# Patient Record
Sex: Female | Born: 1980
Health system: Southern US, Community
[De-identification: ages and names within clinical notes are randomized; demographics above are authoritative.]

## PROBLEM LIST (undated history)

## (undated) DIAGNOSIS — T8859XA Other complications of anesthesia, initial encounter: Secondary | ICD-10-CM

## (undated) DIAGNOSIS — T4145XA Adverse effect of unspecified anesthetic, initial encounter: Secondary | ICD-10-CM

## (undated) DIAGNOSIS — Z789 Other specified health status: Secondary | ICD-10-CM

## (undated) DIAGNOSIS — Z9889 Other specified postprocedural states: Secondary | ICD-10-CM

## (undated) DIAGNOSIS — R112 Nausea with vomiting, unspecified: Secondary | ICD-10-CM

---
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
## 2000-10-08 ENCOUNTER — Other Ambulatory Visit: Admission: RE | Admit: 2000-10-08 | Discharge: 2000-10-08 | Payer: Self-pay | Admitting: Obstetrics and Gynecology

## 2000-11-26 ENCOUNTER — Other Ambulatory Visit: Admission: RE | Admit: 2000-11-26 | Discharge: 2000-11-26 | Payer: Self-pay | Admitting: Obstetrics and Gynecology

## 2001-04-20 ENCOUNTER — Other Ambulatory Visit: Admission: RE | Admit: 2001-04-20 | Discharge: 2001-04-20 | Payer: Self-pay | Admitting: Obstetrics and Gynecology

## 2001-11-27 ENCOUNTER — Other Ambulatory Visit: Admission: RE | Admit: 2001-11-27 | Discharge: 2001-11-27 | Payer: Self-pay | Admitting: Obstetrics and Gynecology

## 2002-12-01 ENCOUNTER — Other Ambulatory Visit: Admission: RE | Admit: 2002-12-01 | Discharge: 2002-12-01 | Payer: Self-pay | Admitting: Obstetrics and Gynecology

## 2003-10-26 ENCOUNTER — Emergency Department (HOSPITAL_COMMUNITY): Admission: EM | Admit: 2003-10-26 | Discharge: 2003-10-26 | Payer: Self-pay | Admitting: *Deleted

## 2003-12-02 ENCOUNTER — Other Ambulatory Visit: Admission: RE | Admit: 2003-12-02 | Discharge: 2003-12-02 | Payer: Self-pay | Admitting: Obstetrics and Gynecology

## 2004-12-12 ENCOUNTER — Other Ambulatory Visit: Admission: RE | Admit: 2004-12-12 | Discharge: 2004-12-12 | Payer: Self-pay | Admitting: Gynecology

## 2007-07-01 ENCOUNTER — Other Ambulatory Visit: Admission: RE | Admit: 2007-07-01 | Discharge: 2007-07-01 | Payer: Self-pay | Admitting: Obstetrics and Gynecology

## 2008-09-23 ENCOUNTER — Other Ambulatory Visit: Admission: RE | Admit: 2008-09-23 | Discharge: 2008-09-23 | Payer: Self-pay | Admitting: Obstetrics and Gynecology

## 2008-09-23 ENCOUNTER — Ambulatory Visit: Payer: Self-pay | Admitting: Women's Health

## 2008-09-23 ENCOUNTER — Encounter: Payer: Self-pay | Admitting: Women's Health

## 2008-11-02 ENCOUNTER — Ambulatory Visit: Payer: Self-pay | Admitting: Women's Health

## 2008-11-16 ENCOUNTER — Ambulatory Visit: Payer: Self-pay | Admitting: Women's Health

## 2009-06-21 ENCOUNTER — Inpatient Hospital Stay (HOSPITAL_COMMUNITY): Admission: AD | Admit: 2009-06-21 | Discharge: 2009-06-24 | Payer: Self-pay | Admitting: Obstetrics & Gynecology

## 2010-06-06 LAB — CBC
HCT: 30.6 % — ABNORMAL LOW (ref 36.0–46.0)
Hemoglobin: 10.3 g/dL — ABNORMAL LOW (ref 12.0–15.0)
Hemoglobin: 13.8 g/dL (ref 12.0–15.0)
MCHC: 33.4 g/dL (ref 30.0–36.0)
MCV: 90.7 fL (ref 78.0–100.0)
RBC: 3.37 MIL/uL — ABNORMAL LOW (ref 3.87–5.11)
RBC: 4.65 MIL/uL (ref 3.87–5.11)
WBC: 14.6 10*3/uL — ABNORMAL HIGH (ref 4.0–10.5)
WBC: 8.3 10*3/uL (ref 4.0–10.5)

## 2010-06-06 LAB — RPR: RPR Ser Ql: NONREACTIVE

## 2010-09-04 LAB — ABO/RH: RH Type: POSITIVE

## 2011-02-01 ENCOUNTER — Other Ambulatory Visit: Payer: Self-pay | Admitting: Obstetrics & Gynecology

## 2011-02-02 ENCOUNTER — Inpatient Hospital Stay (HOSPITAL_COMMUNITY)
Admission: AD | Admit: 2011-02-02 | Discharge: 2011-02-02 | Disposition: A | Discharge status: Home / Self Care | Payer: 59 | Source: Ambulatory Visit | Attending: Obstetrics and Gynecology | Admitting: Obstetrics and Gynecology

## 2011-02-02 DIAGNOSIS — O4700 False labor before 37 completed weeks of gestation, unspecified trimester: Secondary | ICD-10-CM | POA: Insufficient documentation

## 2011-02-02 MED ORDER — BETAMETHASONE SOD PHOS & ACET 6 (3-3) MG/ML IJ SUSP
12.0000 mg | Freq: Once | INTRAMUSCULAR | Status: AC
  Administered 2011-02-02: 12 mg via INTRAMUSCULAR
  Filled 2011-02-02: qty 2

## 2011-02-02 NOTE — Progress Notes (Signed)
Pt here for second steriod injection.  

## 2011-02-02 NOTE — ED Notes (Signed)
steroid injection # 2 . Pt to f/u in office as scheduled

## 2011-03-18 LAB — GC/CHLAMYDIA PROBE AMP, GENITAL
Chlamydia: NEGATIVE
Gonorrhea: NEGATIVE

## 2011-03-19 NOTE — L&D Delivery Note (Signed)
Delivery Note - VBAC  Complete dilation at 1004 Onset of pushing at 1010 FHR second stage cat 1 - no decels  Anesthesia: epidural  Delivery of a viable female at 1047 by CNM in LOA position.  Nuchal Cord - none. Cord double clamped after cessation of pulsation, cut by FOB.  Cord blood sample collected  Placenta delivered shultz at 1053 intact with 3 VC.  Placenta to L&D for routine disposal. Uterine tone firm / bleeding small  no laceration identified  Est. Blood Loss (mL):300  Complications: none  Mom to postpartum.  Baby to nursery-stable.  Ramonda Galyon 04/03/2011, 11:15 AM

## 2011-04-03 ENCOUNTER — Inpatient Hospital Stay (HOSPITAL_COMMUNITY)
Admission: AD | Admit: 2011-04-03 | Discharge: 2011-04-05 | DRG: 775 | Disposition: A | Discharge status: Home / Self Care | Payer: 59 | Source: Ambulatory Visit | Attending: Obstetrics & Gynecology | Admitting: Obstetrics & Gynecology

## 2011-04-03 ENCOUNTER — Encounter (HOSPITAL_COMMUNITY): Payer: Self-pay | Admitting: *Deleted

## 2011-04-03 ENCOUNTER — Encounter (HOSPITAL_COMMUNITY): Payer: Self-pay | Admitting: Anesthesiology

## 2011-04-03 ENCOUNTER — Inpatient Hospital Stay (HOSPITAL_COMMUNITY): Payer: 59 | Admitting: Anesthesiology

## 2011-04-03 DIAGNOSIS — O34219 Maternal care for unspecified type scar from previous cesarean delivery: Principal | ICD-10-CM | POA: Diagnosis present

## 2011-04-03 DIAGNOSIS — Z22330 Carrier of Group B streptococcus: Secondary | ICD-10-CM

## 2011-04-03 DIAGNOSIS — O365990 Maternal care for other known or suspected poor fetal growth, unspecified trimester, not applicable or unspecified: Secondary | ICD-10-CM | POA: Diagnosis present

## 2011-04-03 DIAGNOSIS — O99892 Other specified diseases and conditions complicating childbirth: Secondary | ICD-10-CM | POA: Diagnosis present

## 2011-04-03 HISTORY — DX: Adverse effect of unspecified anesthetic, initial encounter: T41.45XA

## 2011-04-03 HISTORY — DX: Other specified health status: Z78.9

## 2011-04-03 HISTORY — DX: Other specified postprocedural states: Z98.890

## 2011-04-03 HISTORY — DX: Other complications of anesthesia, initial encounter: T88.59XA

## 2011-04-03 HISTORY — DX: Other specified postprocedural states: R11.2

## 2011-04-03 LAB — CBC
HCT: 41.1 % (ref 36.0–46.0)
Hemoglobin: 14.4 g/dL (ref 12.0–15.0)
MCH: 29.6 pg (ref 26.0–34.0)
MCHC: 35 g/dL (ref 30.0–36.0)
MCV: 84.4 fL (ref 78.0–100.0)
Platelets: 245 10*3/uL (ref 150–400)
RBC: 4.87 MIL/uL (ref 3.87–5.11)
RDW: 14.4 % (ref 11.5–15.5)
WBC: 11.5 10*3/uL — ABNORMAL HIGH (ref 4.0–10.5)

## 2011-04-03 LAB — RPR: RPR Ser Ql: NONREACTIVE

## 2011-04-03 MED ORDER — IBUPROFEN 600 MG PO TABS: 600.0000 mg | ORAL_TABLET | Freq: Four times a day (QID) | ORAL | Status: DC | PRN

## 2011-04-03 MED ORDER — LIDOCAINE HCL 1.5 % IJ SOLN
INTRAMUSCULAR | Status: DC | PRN
  Administered 2011-04-03 (×2): 5 mL via EPIDURAL

## 2011-04-03 MED ORDER — DIPHENHYDRAMINE HCL 50 MG/ML IJ SOLN: 12.5000 mg | INTRAMUSCULAR | Status: DC | PRN

## 2011-04-03 MED ORDER — LACTATED RINGERS IV SOLN: 500.0000 mL | Freq: Once | INTRAVENOUS | Status: DC

## 2011-04-03 MED ORDER — PENICILLIN G POTASSIUM 5000000 UNITS IJ SOLR
5.0000 10*6.[IU] | Freq: Once | INTRAMUSCULAR | Status: AC
  Administered 2011-04-03: 5 10*6.[IU] via INTRAVENOUS
  Filled 2011-04-03: qty 5

## 2011-04-03 MED ORDER — ACETAMINOPHEN 325 MG PO TABS: 650.0000 mg | ORAL_TABLET | ORAL | Status: DC | PRN

## 2011-04-03 MED ORDER — CITRIC ACID-SODIUM CITRATE 334-500 MG/5ML PO SOLN: 30.0000 mL | ORAL | Status: DC | PRN

## 2011-04-03 MED ORDER — LACTATED RINGERS IV SOLN: 500.0000 mL | INTRAVENOUS | Status: DC | PRN

## 2011-04-03 MED ORDER — LIDOCAINE HCL (PF) 1 % IJ SOLN
30.0000 mL | INTRAMUSCULAR | Status: DC | PRN
  Administered 2011-04-03: 30 mL via SUBCUTANEOUS
  Filled 2011-04-03: qty 30

## 2011-04-03 MED ORDER — EPHEDRINE 5 MG/ML INJ
10.0000 mg | INTRAVENOUS | Status: DC | PRN
  Administered 2011-04-03: 10 mg via INTRAVENOUS
  Filled 2011-04-03: qty 4

## 2011-04-03 MED ORDER — BENZOCAINE-MENTHOL 20-0.5 % EX AERO
INHALATION_SPRAY | CUTANEOUS | Status: AC
  Administered 2011-04-03: 1 via TOPICAL
  Filled 2011-04-03: qty 56

## 2011-04-03 MED ORDER — ONDANSETRON HCL 4 MG/2ML IJ SOLN: 4.0000 mg | Freq: Four times a day (QID) | INTRAMUSCULAR | Status: DC | PRN

## 2011-04-03 MED ORDER — FENTANYL 2.5 MCG/ML BUPIVACAINE 1/10 % EPIDURAL INFUSION (WH - ANES)
INTRAMUSCULAR | Status: DC | PRN
  Administered 2011-04-03: 14 mL/h via EPIDURAL

## 2011-04-03 MED ORDER — OXYTOCIN BOLUS FROM INFUSION
500.0000 mL | Freq: Once | INTRAVENOUS | Status: AC
  Administered 2011-04-03: 500 mL via INTRAVENOUS
  Filled 2011-04-03: qty 500
  Filled 2011-04-03: qty 1000

## 2011-04-03 MED ORDER — PHENYLEPHRINE 40 MCG/ML (10ML) SYRINGE FOR IV PUSH (FOR BLOOD PRESSURE SUPPORT)
80.0000 ug | PREFILLED_SYRINGE | INTRAVENOUS | Status: DC | PRN
  Administered 2011-04-03 (×2): 80 ug via INTRAVENOUS

## 2011-04-03 MED ORDER — OXYCODONE-ACETAMINOPHEN 5-325 MG PO TABS: 2.0000 | ORAL_TABLET | ORAL | Status: DC | PRN

## 2011-04-03 MED ORDER — LANOLIN HYDROUS EX OINT: TOPICAL_OINTMENT | CUTANEOUS | Status: DC | PRN

## 2011-04-03 MED ORDER — FLEET ENEMA 7-19 GM/118ML RE ENEM: 1.0000 | ENEMA | RECTAL | Status: DC | PRN

## 2011-04-03 MED ORDER — OXYCODONE-ACETAMINOPHEN 5-325 MG PO TABS: 1.0000 | ORAL_TABLET | ORAL | Status: DC | PRN

## 2011-04-03 MED ORDER — EPHEDRINE 5 MG/ML INJ: 10.0000 mg | INTRAVENOUS | Status: DC | PRN

## 2011-04-03 MED ORDER — LACTATED RINGERS IV SOLN
INTRAVENOUS | Status: DC
  Administered 2011-04-03 (×3): via INTRAVENOUS

## 2011-04-03 MED ORDER — DIPHENHYDRAMINE HCL 25 MG PO CAPS: 25.0000 mg | ORAL_CAPSULE | Freq: Four times a day (QID) | ORAL | Status: DC | PRN

## 2011-04-03 MED ORDER — WITCH HAZEL-GLYCERIN EX PADS: 1.0000 "application " | MEDICATED_PAD | CUTANEOUS | Status: DC | PRN

## 2011-04-03 MED ORDER — FENTANYL 2.5 MCG/ML BUPIVACAINE 1/10 % EPIDURAL INFUSION (WH - ANES)
14.0000 mL/h | INTRAMUSCULAR | Status: DC
  Administered 2011-04-03: 10 mL/h via EPIDURAL
  Filled 2011-04-03 (×2): qty 60

## 2011-04-03 MED ORDER — BENZOCAINE-MENTHOL 20-0.5 % EX AERO
1.0000 "application " | INHALATION_SPRAY | CUTANEOUS | Status: DC | PRN
  Administered 2011-04-03: 1 via TOPICAL

## 2011-04-03 MED ORDER — DIBUCAINE 1 % RE OINT: 1.0000 "application " | TOPICAL_OINTMENT | RECTAL | Status: DC | PRN

## 2011-04-03 MED ORDER — IBUPROFEN 600 MG PO TABS
600.0000 mg | ORAL_TABLET | Freq: Four times a day (QID) | ORAL | Status: DC
  Administered 2011-04-03 – 2011-04-05 (×8): 600 mg via ORAL
  Filled 2011-04-03 (×8): qty 1

## 2011-04-03 MED ORDER — OXYTOCIN 20 UNITS IN LACTATED RINGERS INFUSION - SIMPLE: 125.0000 mL/h | Freq: Once | INTRAVENOUS | Status: DC

## 2011-04-03 MED ORDER — PHENYLEPHRINE 40 MCG/ML (10ML) SYRINGE FOR IV PUSH (FOR BLOOD PRESSURE SUPPORT)
80.0000 ug | PREFILLED_SYRINGE | INTRAVENOUS | Status: DC | PRN
  Filled 2011-04-03: qty 5

## 2011-04-03 MED ORDER — PRENATAL MULTIVITAMIN CH
1.0000 | ORAL_TABLET | Freq: Every day | ORAL | Status: DC
  Administered 2011-04-03 – 2011-04-05 (×3): 1 via ORAL
  Filled 2011-04-03 (×4): qty 1

## 2011-04-03 MED ORDER — SIMETHICONE 80 MG PO CHEW: 80.0000 mg | CHEWABLE_TABLET | ORAL | Status: DC | PRN

## 2011-04-03 MED ORDER — PENICILLIN G POTASSIUM 5000000 UNITS IJ SOLR
2.5000 10*6.[IU] | INTRAMUSCULAR | Status: DC
  Administered 2011-04-03: 2.5 10*6.[IU] via INTRAVENOUS
  Filled 2011-04-03 (×3): qty 2.5

## 2011-04-03 NOTE — Anesthesia Procedure Notes (Signed)
Epidural Patient location during procedure: OB Start time: 04/03/2011 4:26 AM  Staffing Anesthesiologist: Brayton Caves R Performed by: anesthesiologist   Preanesthetic Checklist Completed: patient identified, site marked, surgical consent, pre-op evaluation, timeout performed, IV checked, risks and benefits discussed and monitors and equipment checked  Epidural Patient position: sitting Prep: site prepped and draped and DuraPrep Patient monitoring: continuous pulse ox and blood pressure Approach: midline Injection technique: LOR air and LOR saline  Needle:  Needle type: Tuohy  Needle gauge: 17 G Needle length: 9 cm Needle insertion depth: 6 cm Catheter type: closed end flexible Catheter size: 19 Gauge Catheter at skin depth: 11 cm Test dose: negative  Assessment Events: blood not aspirated, injection not painful, no injection resistance, negative IV test and no paresthesia  Additional Notes Patient identified.  Risk benefits discussed including failed block, incomplete pain control, headache, nerve damage, paralysis, blood pressure changes, nausea, vomiting, reactions to medication both toxic or allergic, and postpartum back pain.  Patient expressed understanding and wished to proceed.  All questions were answered.  Sterile technique used throughout procedure and epidural site dressed with sterile barrier dressing. No paresthesia or other complications noted.The patient did not experience any signs of intravascular injection such as tinnitus or metallic taste in mouth nor signs of intrathecal spread such as rapid motor block. Please see nursing notes for vital signs.

## 2011-04-03 NOTE — Anesthesia Preprocedure Evaluation (Signed)
Anesthesia Evaluation  Patient identified by MRN, date of birth, ID band Patient awake    Reviewed: Allergy & Precautions, H&P , Patient's Chart, lab work & pertinent test results  History of Anesthesia Complications (+) PONV  Airway Mallampati: II TM Distance: >3 FB Neck ROM: full    Dental No notable dental hx.    Pulmonary neg pulmonary ROS,  clear to auscultation  Pulmonary exam normal       Cardiovascular neg cardio ROS regular Normal    Neuro/Psych Negative Neurological ROS  Negative Psych ROS   GI/Hepatic negative GI ROS, Neg liver ROS,   Endo/Other  Negative Endocrine ROS  Renal/GU negative Renal ROS     Musculoskeletal   Abdominal   Peds  Hematology negative hematology ROS (+)   Anesthesia Other Findings   Reproductive/Obstetrics (+) Pregnancy                           Anesthesia Physical Anesthesia Plan  ASA: II  Anesthesia Plan: Epidural   Post-op Pain Management:    Induction:   Airway Management Planned:   Additional Equipment:   Intra-op Plan:   Post-operative Plan:   Informed Consent: I have reviewed the patients History and Physical, chart, labs and discussed the procedure including the risks, benefits and alternatives for the proposed anesthesia with the patient or authorized representative who has indicated his/her understanding and acceptance.     Plan Discussed with:   Anesthesia Plan Comments:         Anesthesia Quick Evaluation  

## 2011-04-03 NOTE — Progress Notes (Signed)
Pt sttes she had cramping start about 2300-states she feels the cramping in her vagina

## 2011-04-03 NOTE — H&P (Signed)
  OB ADMISSION/ HISTORY & PHYSICAL:  Admission Date: 04/03/2011  1:44 AM  Admit Diagnosis: Active labor / previous cesarean section - desires TOLAC  Regina Mason is a 31 y.o. female presenting for onset of ctx at midnight.  Prenatal History: G2P1001   EDC : 04/12/2011, by Last Menstrual Period  Prenatal care at [facility] Primary OB provider - Marlinda Mike CNM  Prenatal course complicated by previous c-section - arrest of active labor / + GBS  Prenatal Labs: ABO, Rh: A (06/19 0000) Positive Antibody: Negative (06/19 0000) Rubella: Immune (07/03 0000)  RPR: Nonreactive (07/03 0000)  HBsAg: Negative (06/19 0000)  HIV: Non-reactive (07/03 0000)  GBS: Positive (12/31 0000)  1 hr Glucola : normal / passed GTT   Medical / Surgical History :  Past medical history:  Past Medical History  Diagnosis Date  . Complication of anesthesia   . PONV (postoperative nausea and vomiting)   . No pertinent past medical history      Past surgical history:  Past Surgical History  Procedure Date  . Cesarean section      Family History:  Family History  Problem Relation Age of Onset  . Anesthesia problems Neg Hx   . Hypotension Neg Hx   . Malignant hyperthermia Neg Hx   . Pseudochol deficiency Neg Hx      Social History:  reports that she has never smoked. She does not have any smokeless tobacco history on file. She reports that she does not drink alcohol or use illicit drugs.   Allergies: Review of patient's allergies indicates no known allergies.   Current Medications at time of admission:  Prenatal vitamin  Review of Systems: + ctx since midnight / no LOF / small spotting  Physical Exam: General: pleasant / ctx uncomfortable Heart:RRR Lungs: Clear  Abdomen: Soft / gravid / non-tender Extremities:no edema Genitalia / VE: 4cm / 80% / vtx / -1 / intact membranes per triage nurse  FHR: 145 / cat 1  TOCO: 2-4 minutes  Assessment: Active  labor Previous c-section - desires TOLAC + GBS  Plan:  Admit PCN protocol for GBS prophylaxis Epidural for pain management  Regina Mason 04/03/2011, 0400

## 2011-04-03 NOTE — Progress Notes (Signed)
Patient ID: Regina Mason, female   DOB: 11-02-1980, 31 y.o.   MRN: [id number]  S: Feeling some intermittent pressure - no pain     Tolerating contractions well since epidural   O:  VS: Blood pressure 110/62, pulse 88, temperature 98.2 F (36.8 C), temperature source Oral, resp. rate 20, height 5\' 2"  (1.575 m), weight 91.173 kg (201 lb), last menstrual period 07/06/2010, SpO2 100.00%.        FHR : baseline 145 / variability moderate / accels + / decels none        Toco: contractions every 2-4 minutes / moderate         Cervix : 7/90/vtx/ o station / BBOW / bloody show +        Membranes: AROM - clear   A: Active labor - TOLAC     FHR category 1  P: expectant management       Recheck with constant pressure / urge to push or in 1-2 hours  Dr Billy Coast if c-section per patient - Dr Billy Coast updated with admission / status  [name] 04/03/2011, 9:16 AM

## 2011-04-03 NOTE — Progress Notes (Signed)
Patient ID: Regina Mason, female   DOB: Jul 19, 1980, 31 y.o.   MRN: [id number] S: Feeling strong urge to push     Tolerating contractions ok with epidural - pain with pressure during ctx   O:  VS: Blood pressure 110/62, pulse 110, temperature 97.9 F (36.6 C), temperature source Oral, resp. rate 20, height 5\' 2"  (1.575 m), weight 91.173 kg (201 lb), last menstrual period 07/06/2010, SpO2 100.00%.        FHR : baseline 145 / variability moderate         Toco: contractions every 2-3 minutes / moderate         Cervix : 10 / 100  vtx +2        Membranes: clear fluid with show  A: Active labor     FHR category 1  P: begin active second stage     Dr Billy Coast updated     Marlinda Mike 04/03/2011, 10:26 AM

## 2011-04-04 ENCOUNTER — Encounter (HOSPITAL_COMMUNITY): Payer: Self-pay | Admitting: *Deleted

## 2011-04-04 LAB — CBC
HCT: 35.4 % — ABNORMAL LOW (ref 36.0–46.0)
Hemoglobin: 12.2 g/dL (ref 12.0–15.0)
MCH: 29.2 pg (ref 26.0–34.0)
MCHC: 34.5 g/dL (ref 30.0–36.0)
MCV: 84.7 fL (ref 78.0–100.0)
Platelets: 227 10*3/uL (ref 150–400)
RBC: 4.18 MIL/uL (ref 3.87–5.11)
RDW: 14.7 % (ref 11.5–15.5)
WBC: 14.5 10*3/uL — ABNORMAL HIGH (ref 4.0–10.5)

## 2011-04-04 NOTE — Progress Notes (Signed)
PPD 1 SVD  S:  Reports feeling well, working on breastfeeding, infant sleepy and poor latch.             Tolerating po/ No nausea or vomiting             Bleeding is decreased             Pain controlled withMotrin             Up ad lib / ambulatory  Newborn  Information for the patient's newborn:  Gillian, Meeuwsen [[id number]]  female    O:  A & O x 3 NAD             VS: Blood pressure 108/61, pulse 87, temperature 98 F (36.7 C), temperature source Oral, resp. rate 18, height 5\' 2"  (1.575 m), weight 91.173 kg (201 lb), last menstrual period 07/06/2010, SpO2 97.00%, unknown if currently breastfeeding.  LABS:  Basename 04/04/11 0530 04/03/11 0315  HGB 12.2 14.4  HCT 35.4* 41.1    I&O: I/O last 3 completed shifts: In: 800 [Other:800] Out: 1425 [Urine:1125; Blood:300]      Lungs: Clear and unlabored  Heart: regular rate and rhythm / no mumurs  Abdomen: soft, non-tender, non-distended              Fundus: firm, non-tender, @U   Perineum: intact, mild edema  Lochia: small  Extremities: trace edema, no calf pain or tenderness, neg Homans    A/P: PPD # 1 31 y.o., [id number] S/P: VBAC    Doing well - stable status  Routine post partum orders  Lactation support today for SGA infant (2500gm)  Anticipate discharge home in AM.  PAUL,DANIELA, CNM, MSN 04/04/2011, 10:37 AM

## 2011-04-04 NOTE — Anesthesia Postprocedure Evaluation (Signed)
  Anesthesia Post-op Note  Patient: Regina Mason  Procedure(s) Performed: * No procedures listed *  Patient Location: PACU and Mother/Baby  Anesthesia Type: Epidural  Level of Consciousness: awake, alert  and oriented  Airway and Oxygen Therapy: Patient Spontanous Breathing  Post-op Assessment: Post-op Vital signs reviewed and Patient's Cardiovascular Status Stable  Post-op Vital Signs: Reviewed and stable  Complications: No apparent anesthesia complications

## 2011-04-04 NOTE — Progress Notes (Signed)
SW consult received for "babies who have drug screen sent." SW reviewed babies chart and there has not been any drug screens ordered.  Therefore, SW has screened out this referral as an error.  SW will only see if appropriate consult is ordered. 

## 2011-04-04 NOTE — Anesthesia Postprocedure Evaluation (Signed)
  Anesthesia Post-op Note  Patient: Regina Mason  Procedure(s) Performed: * No procedures listed *  Patient Location: PACU and Mother/Baby  Anesthesia Type: Epidural  Level of Consciousness: awake, alert  and oriented  Airway and Oxygen Therapy: Patient Spontanous Breathing  Post-op Assessment: Post-op Vital signs reviewed, Patient's Cardiovascular Status Stable, No headache, No backache, No residual numbness and No residual motor weakness  Post-op Vital Signs: Reviewed and stable  Complications: No apparent anesthesia complications

## 2011-04-05 ENCOUNTER — Encounter (HOSPITAL_COMMUNITY): Payer: Self-pay | Admitting: Obstetrics and Gynecology

## 2011-04-05 MED ORDER — TETANUS-DIPHTH-ACELL PERTUSSIS 5-2.5-18.5 LF-MCG/0.5 IM SUSP
0.5000 mL | Freq: Once | INTRAMUSCULAR | Status: AC
  Administered 2011-04-05: 0.5 mL via INTRAMUSCULAR
  Filled 2011-04-05: qty 0.5

## 2011-04-05 MED ORDER — IBUPROFEN 600 MG PO TABS: 600.0000 mg | ORAL_TABLET | Freq: Four times a day (QID) | ORAL | Status: AC

## 2011-04-05 NOTE — Discharge Summary (Signed)
Obstetric Discharge Summary Reason for Admission: onset of labor Prenatal Procedures: ultrasound Intrapartum Procedures: spontaneous vaginal delivery Postpartum Procedures: none Complications-Operative and Postpartum: none Hemoglobin  Date Value Range Status  04/04/2011 12.2  12.0-15.0 (g/dL) Final     HCT  Date Value Range Status  04/04/2011 35.4* 36.0-46.0 (%) Final    Discharge Diagnoses: Term Pregnancy-delivered and Pos GBS  Discharge Information: Date: 04/05/2011 Activity: pelvic rest Diet: routine Medications: Ibuprofen Condition: stable Instructions: refer to practice specific booklet Discharge to: home Follow-up Information    Follow up with BAILEY,TANYA, CNM in 6 weeks.   Contact information:   [address] [place] [address] [phone]          Newborn Data: Live born female on 04/03/11 Birth Weight: 5 lb 10.7 oz (2571 g) APGAR: 9, 9  Home with mother.  Regina Mason K 04/05/2011, 10:27 AM

## 2011-04-05 NOTE — Progress Notes (Signed)
Patient ID: Regina Mason, female   DOB: 25-Jul-1980, 31 y.o.   MRN: [id number] PPD # 2  Subjective: Pt reports feeling well and ready for d/c home/ Pain controlled with prescription NSAID's including motrin Tolerating po/ Voiding without problems/ No n/v Bleeding is light/ Newborn info:  Information for the patient's newborn:  [name], [name] [[id number]]  female  Feeding: breast    Objective:  VS: Blood pressure 95/64, pulse 78, temperature 97.7 F (36.5 C), temperature source Oral, resp. rate 18   Basename 04/04/11 0530 04/03/11 0315  WBC 14.5* 11.5*  HGB 12.2 14.4  HCT 35.4* 41.1  PLT 227 245    Blood type: A/Positive/-- (06/19 0000) Rubella: Immune (07/03 0000)    Physical Exam:  General: alert, cooperative and no distress Abdomen: soft, nontender, normal bowel sounds Uterine Fundus: firm, below umbilicus, nontender Perineum: not inspected Lochia: minimal Ext: Homans sign is negative, no sign of DVT and no edema, redness or tenderness in the calves or thighs   A/P: PPD # 2/ [id number] Doing well and stable for discharge home RX: Ibuprofen 600mg  po Q 6 hrs prn pain #30 Refill x 1 WOB/GYN booklet given Routine pp visit in 6wks  Signed: Arlana Lindau [facility] 04/05/11 1000

## 2011-04-07 NOTE — Discharge Summary (Signed)
Reviewed, agree 

## 2011-04-09 ENCOUNTER — Ambulatory Visit (HOSPITAL_COMMUNITY)
Admit: 2011-04-09 | Discharge: 2011-04-09 | Disposition: A | Discharge status: Home / Self Care | Payer: 59 | Attending: Obstetrics & Gynecology | Admitting: Obstetrics & Gynecology

## 2011-04-09 NOTE — Progress Notes (Signed)
Adult Lactation Consultation Outpatient Visit Note  Patient Name: Regina Mason(mother)     BABY:  [name] Date of Birth: 08/12/1980                                  DOB:  [birth year] Gestational Age at Delivery: [redacted]w[redacted]d                 Birth weight:  5-10.7 Type of Delivery: NSVD                                    Weight today: 5-14.6  Breastfeeding History: Frequency of Breastfeeding: every 1 -3 hours Length of Feeding: 15-20 minutes Voids: 6-8/24 hrs Stools: 2-3 yellow/24 hrs  Supplementing / Method:NONE Pumping:  Type of Pump:Pumo in style   Frequency:prn for comfort only  Volume:    Comments:    Consultation Evaluation:Mother here with c/o engorgement x 2 days and baby sleepy at breast.  Baby is 2 days old and past birth weight with exclusive breastfeeding.  Mothers breasts very full with slight engorgement.  Reviewed engorgement treatment with patient.  Observed mother latch baby easily and well to right breast.  Baby nursed very well x 10-15 min with swallow after every suck.  Breast softened nicely after feeding.  Instructed on waking techniques and methods to keep baby active at breast.  Initial Feeding Assessment: Pre-feed Weight: Post-feed Weight: Amount Transferred: Comments:  Additional Feeding Assessment: Pre-feed Weight: Post-feed Weight: Amount Transferred: Comments:  Additional Feeding Assessment: Pre-feed Weight: Post-feed Weight: Amount Transferred: Comments:  Total Breast milk Transferred this Visit: pc weight not done Total Supplement Given: none  Additional Interventions:   Follow-Up Will call prn     Hansel Feinstein 04/09/2011, 4:15 PM

## 2011-04-26 ENCOUNTER — Emergency Department (HOSPITAL_COMMUNITY)
Admission: EM | Admit: 2011-04-26 | Discharge: 2011-04-27 | Disposition: A | Discharge status: Home / Self Care | Payer: 59 | Attending: Emergency Medicine | Admitting: Emergency Medicine

## 2011-04-26 ENCOUNTER — Encounter (HOSPITAL_COMMUNITY): Payer: Self-pay | Admitting: Emergency Medicine

## 2011-04-26 DIAGNOSIS — R51 Headache: Secondary | ICD-10-CM | POA: Insufficient documentation

## 2011-04-26 DIAGNOSIS — K219 Gastro-esophageal reflux disease without esophagitis: Secondary | ICD-10-CM | POA: Insufficient documentation

## 2011-04-26 DIAGNOSIS — R10816 Epigastric abdominal tenderness: Secondary | ICD-10-CM | POA: Insufficient documentation

## 2011-04-26 DIAGNOSIS — R1084 Generalized abdominal pain: Secondary | ICD-10-CM | POA: Insufficient documentation

## 2011-04-26 DIAGNOSIS — R3919 Other difficulties with micturition: Secondary | ICD-10-CM | POA: Insufficient documentation

## 2011-04-26 MED ORDER — FAMOTIDINE 20 MG PO TABS
40.0000 mg | ORAL_TABLET | Freq: Once | ORAL | Status: AC
  Administered 2011-04-26: 40 mg via ORAL
  Filled 2011-04-26: qty 2

## 2011-04-26 MED ORDER — GI COCKTAIL ~~LOC~~
30.0000 mL | Freq: Once | ORAL | Status: AC
  Administered 2011-04-26: 30 mL via ORAL
  Filled 2011-04-26: qty 30

## 2011-04-26 NOTE — ED Provider Notes (Signed)
History     CSN: [id number]  Arrival date & time 04/26/11  2025   First MD Initiated Contact with Patient 04/26/11 2301      Chief Complaint  Patient presents with  . Abdominal Pain    Generalized  . Headache    (Consider location/radiation/quality/duration/timing/severity/associated sxs/prior treatment) Patient is a 31 y.o. female presenting with abdominal pain and headaches. The history is provided by the patient.  Abdominal Pain The primary symptoms of the illness include abdominal pain.  Headache    patient presents with upper epigastric pain radiating to her back x7 hours. No associated fever, vomiting, diarrhea. Pain described as sharp in nature. She took Motrin for this symptoms improved. She did have some initial associated headache which has since resolved. Patient has a urinary symptoms. Nothing makes her symptoms worse no prior history of same. Notes a recent vaginal delivery 3 weeks ago, denies any pelvic pain some vaginal bleeding remains however has been decreasing.  Past Medical History  Diagnosis Date  . Complication of anesthesia   . PONV (postoperative nausea and vomiting)   . No pertinent past medical history   . Postpartum care following vaginal delivery 04/05/2011    Past Surgical History  Procedure Date  . Cesarean section     Family History  Problem Relation Age of Onset  . Anesthesia problems Neg Hx   . Hypotension Neg Hx   . Malignant hyperthermia Neg Hx   . Pseudochol deficiency Neg Hx     History  Substance Use Topics  . Smoking status: Never Smoker   . Smokeless tobacco: Not on file  . Alcohol Use: No    OB History    Grav Para Term Preterm Abortions TAB SAB Ect Mult Living   5 2 2       2       Review of Systems  Gastrointestinal: Positive for abdominal pain.  Neurological: Positive for headaches.  All other systems reviewed and are negative.    Allergies  Review of patient's allergies indicates no known allergies.  Home  Medications   Current Outpatient Rx  Name Route Sig Dispense Refill  . IBUPROFEN 200 MG PO TABS Oral Take 600 mg by mouth every 6 (six) hours as needed.    Marland Kitchen PRENATAL MULTIVITAMIN CH Oral Take 1 tablet by mouth daily.      BP 156/82  Pulse 58  Temp(Src) 97.4 F (36.3 C) (Oral)  Resp 18  Wt 201 lb (91.173 kg)  SpO2 100%  LMP 07/06/2010  Breastfeeding? Unknown  Physical Exam  Nursing note and vitals reviewed. Constitutional: She is oriented to person, place, and time. She appears well-developed and well-nourished.  Non-toxic appearance. No distress.  HENT:  Head: Normocephalic and atraumatic.  Eyes: Conjunctivae, EOM and lids are normal. Pupils are equal, round, and reactive to light.  Neck: Normal range of motion. Neck supple. No tracheal deviation present. No mass present.  Cardiovascular: Normal rate, regular rhythm and normal heart sounds.  Exam reveals no gallop.   No murmur heard. Pulmonary/Chest: Effort normal and breath sounds normal. No stridor. No respiratory distress. She has no decreased breath sounds. She has no wheezes. She has no rhonchi. She has no rales.  Abdominal: Soft. Normal appearance and bowel sounds are normal. She exhibits no distension. There is tenderness in the epigastric area. There is no rigidity, no rebound, no guarding and no CVA tenderness.  Musculoskeletal: Normal range of motion. She exhibits no edema and no tenderness.  Neurological: She  is alert and oriented to person, place, and time. She has normal strength. No cranial nerve deficit or sensory deficit. GCS eye subscore is 4. GCS verbal subscore is 5. GCS motor subscore is 6.  Skin: Skin is warm and dry. No abrasion and no rash noted.  Psychiatric: She has a normal mood and affect. Her speech is normal and behavior is normal.    ED Course  Procedures (including critical care time)   Labs Reviewed  URINALYSIS, ROUTINE W REFLEX MICROSCOPIC   No results found.   No diagnosis  found.    MDM  Patient given GI cocktail and Pepcid. She feels better suspect the patient has GERD patient to be discharged        Toy Baker, MD 04/27/11 0100

## 2011-04-26 NOTE — ED Notes (Signed)
Pt alert, c/o gen abd pain, onset this evening, pt states sudden onset denies n/v, denies chages in bowel or bladder, recent child birth jan 16th

## 2011-04-27 LAB — URINALYSIS, ROUTINE W REFLEX MICROSCOPIC
Bilirubin Urine: NEGATIVE
Nitrite: NEGATIVE
Protein, ur: NEGATIVE mg/dL
Specific Gravity, Urine: 1.015 (ref 1.005–1.030)
Urobilinogen, UA: 0.2 mg/dL (ref 0.0–1.0)

## 2011-04-27 LAB — URINE MICROSCOPIC-ADD ON

## 2011-04-27 MED ORDER — PANTOPRAZOLE SODIUM 20 MG PO TBEC: 20.0000 mg | DELAYED_RELEASE_TABLET | Freq: Every day | ORAL | Status: DC

## 2011-04-27 MED ORDER — SUCRALFATE 1 G PO TABS: 1.0000 g | ORAL_TABLET | Freq: Four times a day (QID) | ORAL | Status: DC

## 2013-01-16 ENCOUNTER — Ambulatory Visit (INDEPENDENT_AMBULATORY_CARE_PROVIDER_SITE_OTHER): Payer: 59 | Admitting: Family Medicine

## 2013-01-16 VITALS — BP 108/72 | HR 78 | Temp 97.8°F | Resp 18 | Ht 61.5 in | Wt 201.4 lb

## 2013-01-16 DIAGNOSIS — B373 Candidiasis of vulva and vagina: Secondary | ICD-10-CM

## 2013-01-16 DIAGNOSIS — B3731 Acute candidiasis of vulva and vagina: Secondary | ICD-10-CM

## 2013-01-16 DIAGNOSIS — N898 Other specified noninflammatory disorders of vagina: Secondary | ICD-10-CM

## 2013-01-16 LAB — POCT UA - MICROSCOPIC ONLY
Casts, Ur, LPF, POC: NEGATIVE
Crystals, Ur, HPF, POC: NEGATIVE
Mucus, UA: NEGATIVE
RBC, urine, microscopic: NEGATIVE
WBC, Ur, HPF, POC: NEGATIVE
Yeast, UA: NEGATIVE

## 2013-01-16 LAB — POCT WET PREP WITH KOH
KOH Prep POC: POSITIVE
WBC Wet Prep HPF POC: NEGATIVE
Yeast Wet Prep HPF POC: POSITIVE

## 2013-01-16 LAB — POCT URINALYSIS DIPSTICK
Bilirubin, UA: NEGATIVE
Glucose, UA: NEGATIVE
Ketones, UA: NEGATIVE
Leukocytes, UA: NEGATIVE
Nitrite, UA: NEGATIVE
Protein, UA: NEGATIVE
Spec Grav, UA: 1.015
Urobilinogen, UA: 0.2
pH, UA: 7

## 2013-01-16 MED ORDER — FLUCONAZOLE 150 MG PO TABS: 150.0000 mg | ORAL_TABLET | Freq: Once | ORAL | Status: DC

## 2013-01-16 NOTE — Patient Instructions (Signed)
Use the diflucan for yeast vaginitis  Let us know if you are not better soon.

## 2013-01-16 NOTE — Progress Notes (Signed)
[facility] [address], [place] [address] [id number] [phone]  Date:  01/16/2013   Name:  Regina Mason   DOB:  1980/04/06   MRN:  [id number]  PCP:  No primary provider on file.    Chief Complaint: Vaginal Discharge   History of Present Illness:  Regina Mason is a 32 y.o. very pleasant female patient who presents with the following:  She has noted vaginal itching and thick white discharge for a couple of days.   No dysuria.   No vomiting, fever, abdominal pain Her daughter will be 2 in January.  She is no longer BF her daughter.   She is generally healthy  She is SA with her husband only and is not concerned about STI No chance of current pregnancy   Patient Active Problem List   Diagnosis Date Noted  . Postpartum care following vaginal delivery 04/05/2011    Past Medical History  Diagnosis Date  . Complication of anesthesia   . PONV (postoperative nausea and vomiting)   . No pertinent past medical history   . Postpartum care following vaginal delivery 04/05/2011    Past Surgical History  Procedure Laterality Date  . Cesarean section      History  Substance Use Topics  . Smoking status: Never Smoker   . Smokeless tobacco: Not on file  . Alcohol Use: No    Family History  Problem Relation Age of Onset  . Anesthesia problems Neg Hx   . Hypotension Neg Hx   . Malignant hyperthermia Neg Hx   . Pseudochol deficiency Neg Hx   . Thyroid disease Mother   . Hypertension Father   . Thyroid disease Brother   . Diabetes Maternal Grandmother   . Heart disease Paternal Grandmother   . Sickle cell anemia Brother   . Sleep apnea Brother     No Known Allergies  Medication list has been reviewed and updated.  Current Outpatient Prescriptions on File Prior to Visit  Medication Sig Dispense Refill  . ibuprofen (ADVIL,MOTRIN) 200 MG tablet Take 600 mg by mouth every 6 (six) hours as needed.      . pantoprazole (PROTONIX) 20 MG tablet Take 1  tablet (20 mg total) by mouth daily.  30 tablet  1  . Prenatal Vit-Fe Fumarate-FA (PRENATAL MULTIVITAMIN) TABS Take 1 tablet by mouth daily.      . sucralfate (CARAFATE) 1 G tablet Take 1 tablet (1 g total) by mouth 4 (four) times daily.  30 tablet  0   No current facility-administered medications on file prior to visit.    Review of Systems:  As per HPI- otherwise negative.   Physical Examination: Filed Vitals:   01/16/13 1646  BP: 108/72  Pulse: 78  Temp: 97.8 F (36.6 C)  Resp: 18   Filed Vitals:   01/16/13 1646  Height: 5' 1.5" (1.562 m)  Weight: 201 lb 6.4 oz (91.354 kg)   Body mass index is 37.44 kg/(m^2). Ideal Body Weight: Weight in (lb) to have BMI = 25: 134.2  GEN: WDWN, NAD, Non-toxic, A & O x 3 HEENT: Atraumatic, Normocephalic. Neck supple. No masses, No LAD. Ears and Nose: No external deformity. CV: RRR, No M/G/R. No JVD. No thrill. No extra heart sounds. PULM: CTA B, no wheezes, crackles, rhonchi. No retractions. No resp. distress. No accessory muscle use. ABD: S, NT, ND EXTR: No c/c/e NEURO Normal gait.  PSYCH: Normally interactive. Conversant. Not depressed or anxious appearing.  Calm demeanor.  GU: thick white vaginal discharge typical of yeast vaginitis.  No adnexal masses or pain.  No CMT  Results for orders placed in visit on 01/16/13  POCT URINALYSIS DIPSTICK      Result Value Range   Color, UA yellow     Clarity, UA cloudy     Glucose, UA neg     Bilirubin, UA neg     Ketones, UA neg     Spec Grav, UA 1.015     Blood, UA trace-intact     pH, UA 7.0     Protein, UA neg     Urobilinogen, UA 0.2     Nitrite, UA neg     Leukocytes, UA Negative    POCT UA - MICROSCOPIC ONLY      Result Value Range   WBC, Ur, HPF, POC neg     RBC, urine, microscopic neg     Bacteria, U Microscopic trace     Mucus, UA neg     Epithelial cells, urine per micros 3-5     Crystals, Ur, HPF, POC neg     Casts, Ur, LPF, POC neg     Yeast, UA neg    POCT WET  PREP WITH KOH      Result Value Range   Trichomonas, UA Negative     Clue Cells Wet Prep HPF POC neg     Epithelial Wet Prep HPF POC 3-5     Yeast Wet Prep HPF POC pos     Bacteria Wet Prep HPF POC 1+     RBC Wet Prep HPF POC neg     WBC Wet Prep HPF POC neg     KOH Prep POC Positive      Assessment and Plan: Yeast vaginitis - Plan: fluconazole (DIFLUCAN) 150 MG tablet  Discharge from the vagina - Plan: POCT urinalysis dipstick, POCT UA - Microscopic Only, POCT Wet Prep with KOH  Yeast vaginitis.  Treat with diflucan once, repeat in a week if needed.    Signed Abbe Amsterdam, MD

## 2013-01-21 ENCOUNTER — Other Ambulatory Visit: Payer: Self-pay

## 2014-01-23 ENCOUNTER — Ambulatory Visit (INDEPENDENT_AMBULATORY_CARE_PROVIDER_SITE_OTHER): Payer: 59 | Admitting: Physician Assistant

## 2014-01-23 VITALS — BP 104/68 | HR 81 | Temp 98.4°F | Resp 16 | Ht 62.0 in | Wt 207.0 lb

## 2014-01-23 DIAGNOSIS — R350 Frequency of micturition: Secondary | ICD-10-CM

## 2014-01-23 DIAGNOSIS — N898 Other specified noninflammatory disorders of vagina: Secondary | ICD-10-CM

## 2014-01-23 DIAGNOSIS — N3001 Acute cystitis with hematuria: Secondary | ICD-10-CM

## 2014-01-23 LAB — POCT URINALYSIS DIPSTICK
BILIRUBIN UA: NEGATIVE
Glucose, UA: NEGATIVE
KETONES UA: NEGATIVE
Nitrite, UA: NEGATIVE
PH UA: 6
Protein, UA: NEGATIVE
Spec Grav, UA: 1.015
Urobilinogen, UA: 0.2

## 2014-01-23 LAB — POCT WET PREP WITH KOH
KOH PREP POC: NEGATIVE
TRICHOMONAS UA: NEGATIVE
YEAST WET PREP PER HPF POC: NEGATIVE

## 2014-01-23 LAB — POCT UA - MICROSCOPIC ONLY
Casts, Ur, LPF, POC: NEGATIVE
Crystals, Ur, HPF, POC: NEGATIVE
Yeast, UA: NEGATIVE

## 2014-01-23 MED ORDER — NITROFURANTOIN MONOHYD MACRO 100 MG PO CAPS: 100.0000 mg | ORAL_CAPSULE | Freq: Two times a day (BID) | ORAL | Status: AC

## 2014-01-23 NOTE — Progress Notes (Signed)
IDENTIFYING INFORMATION  Regina Mason / DOB: 01-03-1981 / MRN: [id number]  The patient  does not have any active problems on file.  SUBJECTIVE  Chief Complaint: Urinary Frequency and Hematuria   History of present illness: Regina Mason is a 33 y.o. year old female who presents with five days of urinary frequency and urgency, mild odiferous urine, and pelvic fullness. She denies dysuria, suprapubic tenderness, and flank pain at this time. She reports her last UTI was eight years ago, and she denies dysuria at that time.  She denies N/V, and constitutional symptoms today.  She has tried nothing for this problem.    She reports she is sexually active in a monogamous relashionship and has been married for five years. She reports vaginal discharge which she reports as brown and thick, and has a slight odor, which she describes as pungent.     She  has a past medical history of Complication of anesthesia; PONV (postoperative nausea and vomiting); No pertinent past medical history; and Postpartum care following vaginal delivery (04/05/2011).  The patient has a current medication list which includes the following prescription(s): pantoprazole and sucralfate.  Regina Mason has No Known Allergies. She  reports that she has never smoked. She does not have any smokeless tobacco history on file. She reports that she does not drink alcohol or use illicit drugs. She  reports that she currently engages in sexual activity.  The patient  has past surgical history that includes Cesarean section.  Her family history includes Diabetes in her maternal grandmother; Heart disease in her paternal grandmother; Hypertension in her father; Sickle cell anemia in her brother; Sleep apnea in her brother; Thyroid disease in her brother and mother. There is no history of Anesthesia problems, Hypotension, Malignant hyperthermia, or Pseudochol deficiency.  Review of Systems  Constitutional: Negative.   Respiratory:  Negative.   Cardiovascular: Negative.   Gastrointestinal: Negative.   Genitourinary: Positive for urgency, frequency and hematuria. Negative for dysuria and flank pain.  Skin: Negative.     OBJECTIVE  Blood pressure 104/68, pulse 81, temperature 98.4 F (36.9 C), resp. rate 16, height 5\' 2"  (1.575 m), weight 207 lb (93.895 kg), last menstrual period 01/10/2014, SpO2 100 %. The patient's body mass index is 37.85 kg/(m^2).  Physical Exam  Constitutional: She is oriented to person, place, and time and well-developed, well-nourished, and in no distress.  Cardiovascular: Normal rate, regular rhythm and normal heart sounds.   Pulmonary/Chest: Effort normal and breath sounds normal.  Abdominal: Soft. Normal appearance and bowel sounds are normal. She exhibits no distension and no mass. There is tenderness in the suprapubic area. There is no rigidity, no rebound, no guarding and no CVA tenderness.  Neurological: She is alert and oriented to person, place, and time.  Skin: Skin is warm and dry.  Psychiatric: Mood, memory, affect and judgment normal.    Results for orders placed or performed in visit on 01/23/14 (from the past 24 hour(s))  POCT UA - Microscopic Only     Status: None   Collection Time: 01/23/14  9:36 AM  Result Value Ref Range   WBC, Ur, HPF, POC 8-12    RBC, urine, microscopic 2-4    Bacteria, U Microscopic 1+    Mucus, UA trace    Epithelial cells, urine per micros 2-5    Crystals, Ur, HPF, POC neg    Casts, Ur, LPF, POC neg    Yeast, UA neg   POCT urinalysis dipstick  Status: None   Collection Time: 01/23/14  9:36 AM  Result Value Ref Range   Color, UA yellow    Clarity, UA cloudy    Glucose, UA neg    Bilirubin, UA neg    Ketones, UA neg    Spec Grav, UA 1.015    Blood, UA small    pH, UA 6.0    Protein, UA neg    Urobilinogen, UA 0.2    Nitrite, UA neg    Leukocytes, UA moderate (2+)   POCT Wet Prep with KOH     Status: None   Collection Time: 01/23/14  10:11 AM  Result Value Ref Range   Trichomonas, UA Negative    Clue Cells Wet Prep HPF POC 0-2    Epithelial Wet Prep HPF POC 5-8    Yeast Wet Prep HPF POC neg    Bacteria Wet Prep HPF POC 1+    RBC Wet Prep HPF POC 0-2    WBC Wet Prep HPF POC 8-12    KOH Prep POC Negative     ASSESSMENT & PLAN  Regina Mason was seen today for urinary frequency and hematuria.  Diagnoses and associated orders for this visit:  Urinary frequency - POCT UA - Microscopic Only - POCT urinalysis dipstick - Urine culture  Vaginal discharge - POCT Wet Prep with KOH -     Negative  Acute cystitis with hematuria - nitrofurantoin, macrocrystal-monohydrate, (MACROBID) 100 MG capsule; Take 1 capsule (100 mg total) by mouth 2 (two) times daily.     The patient was instructed to to call or comeback to clinic as needed, or should symptoms warrant.  Deliah BostonMichael Clark, MHS, PA-C [facility] 01/23/2014 10:19 AM

## 2014-01-23 NOTE — Patient Instructions (Signed)

## 2014-01-25 LAB — URINE CULTURE

## 2014-01-25 NOTE — Progress Notes (Signed)
I was directly involved with the patient's care and agree with the physical, diagnosis and treatment plan.  

## 2014-04-15 IMAGING — CR DG FOOT COMPLETE 3+V*R*
3 series · 3 of 3 positions shown · non-contrast
Comparison: None.

CLINICAL DATA: Foot and ankle pain for 4 days, fell down stairs

EXAM:
RIGHT FOOT COMPLETE - 3+ VIEW

[AP]
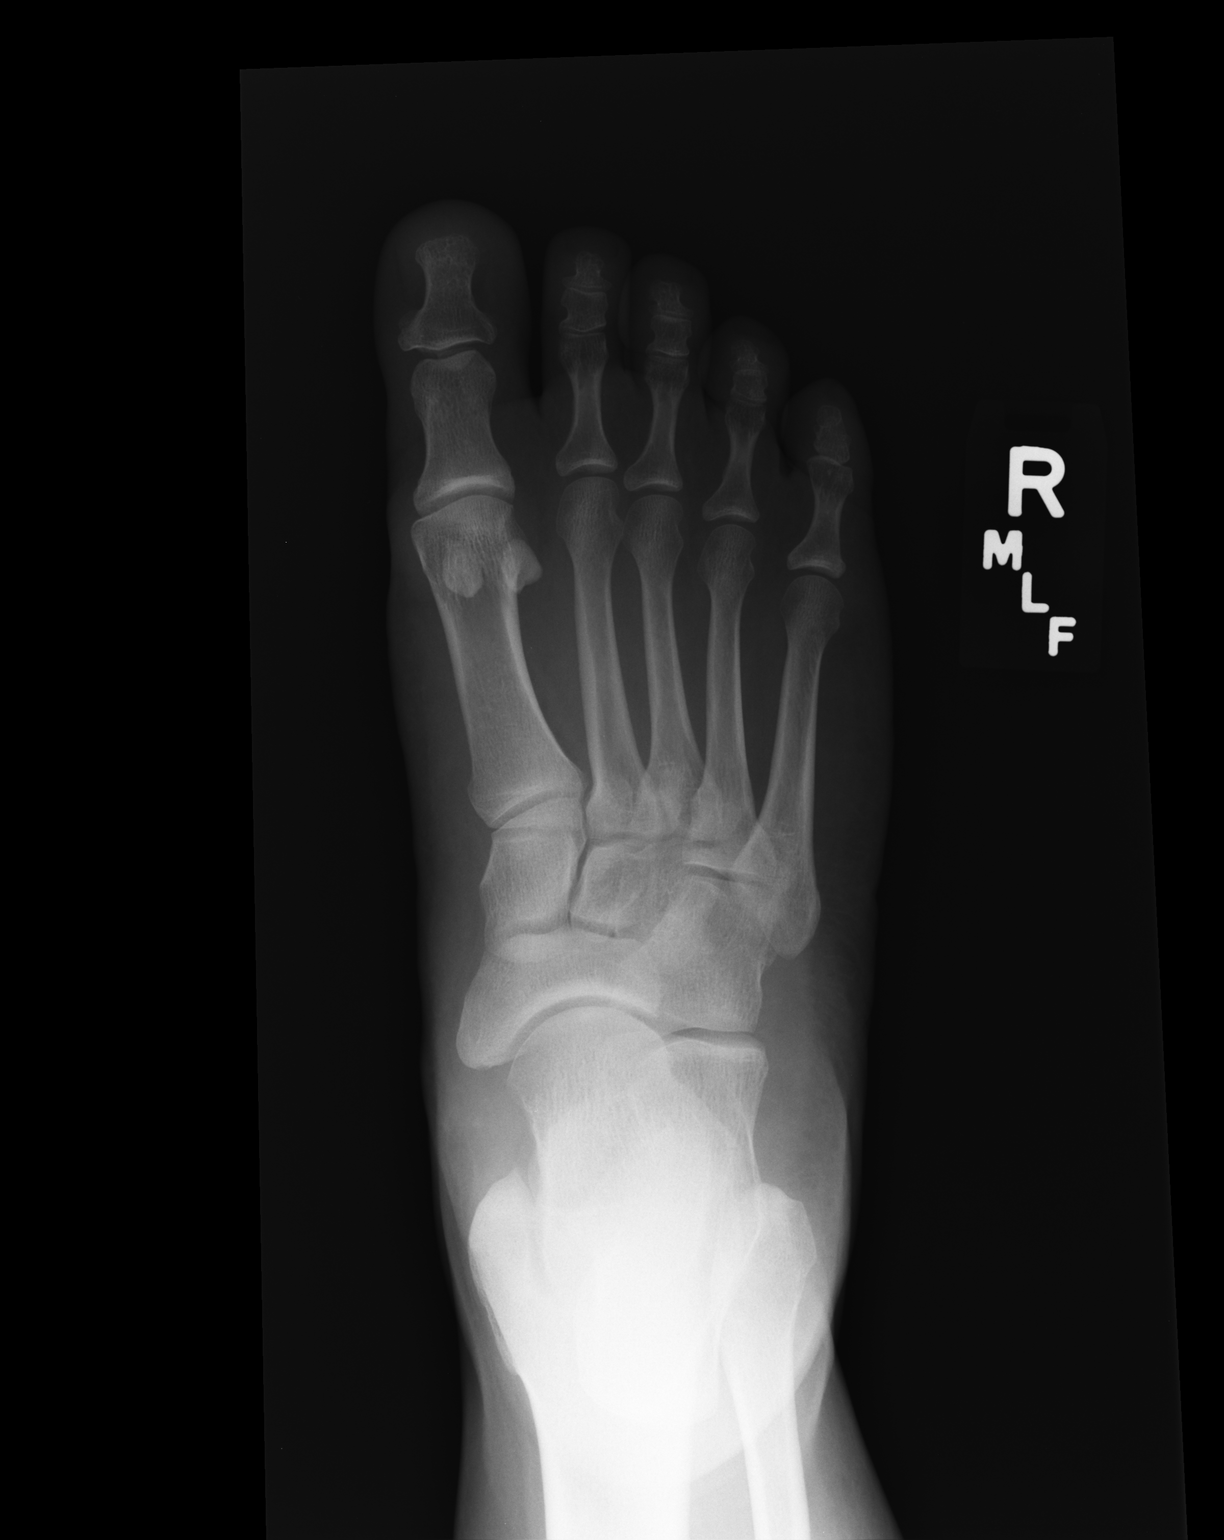

[ap obl int rot]
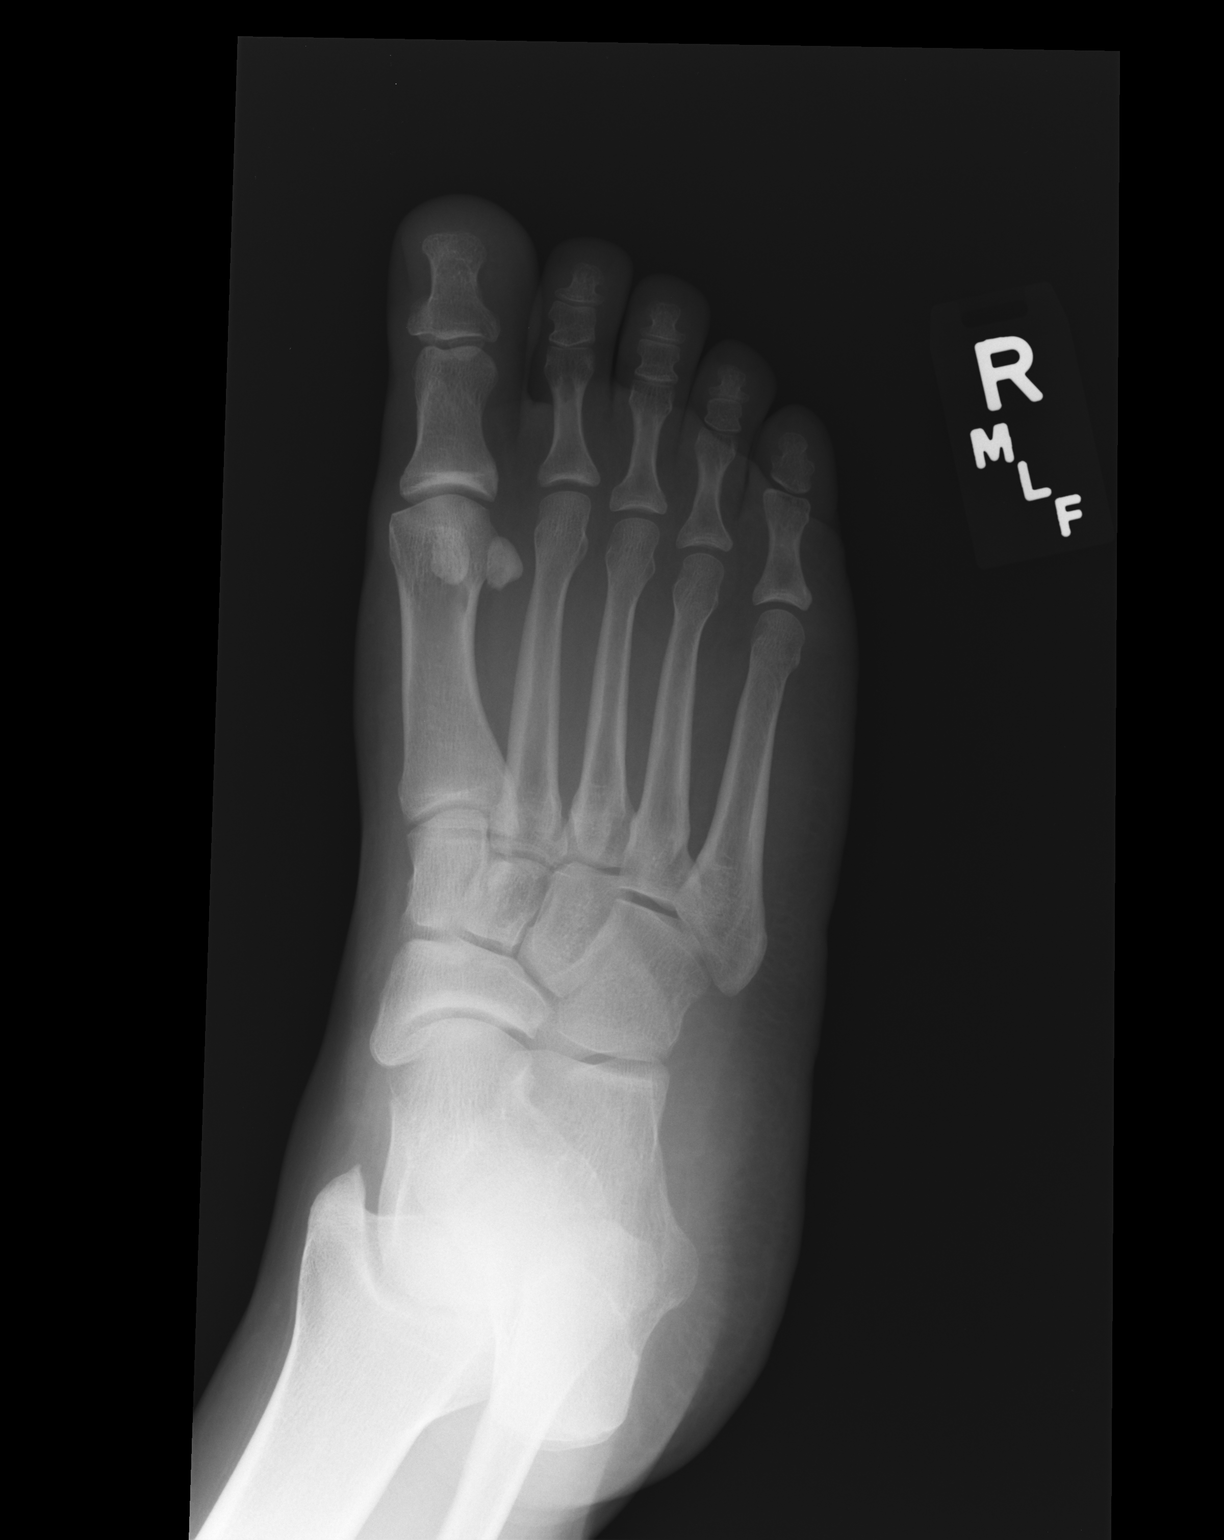

[lateral]
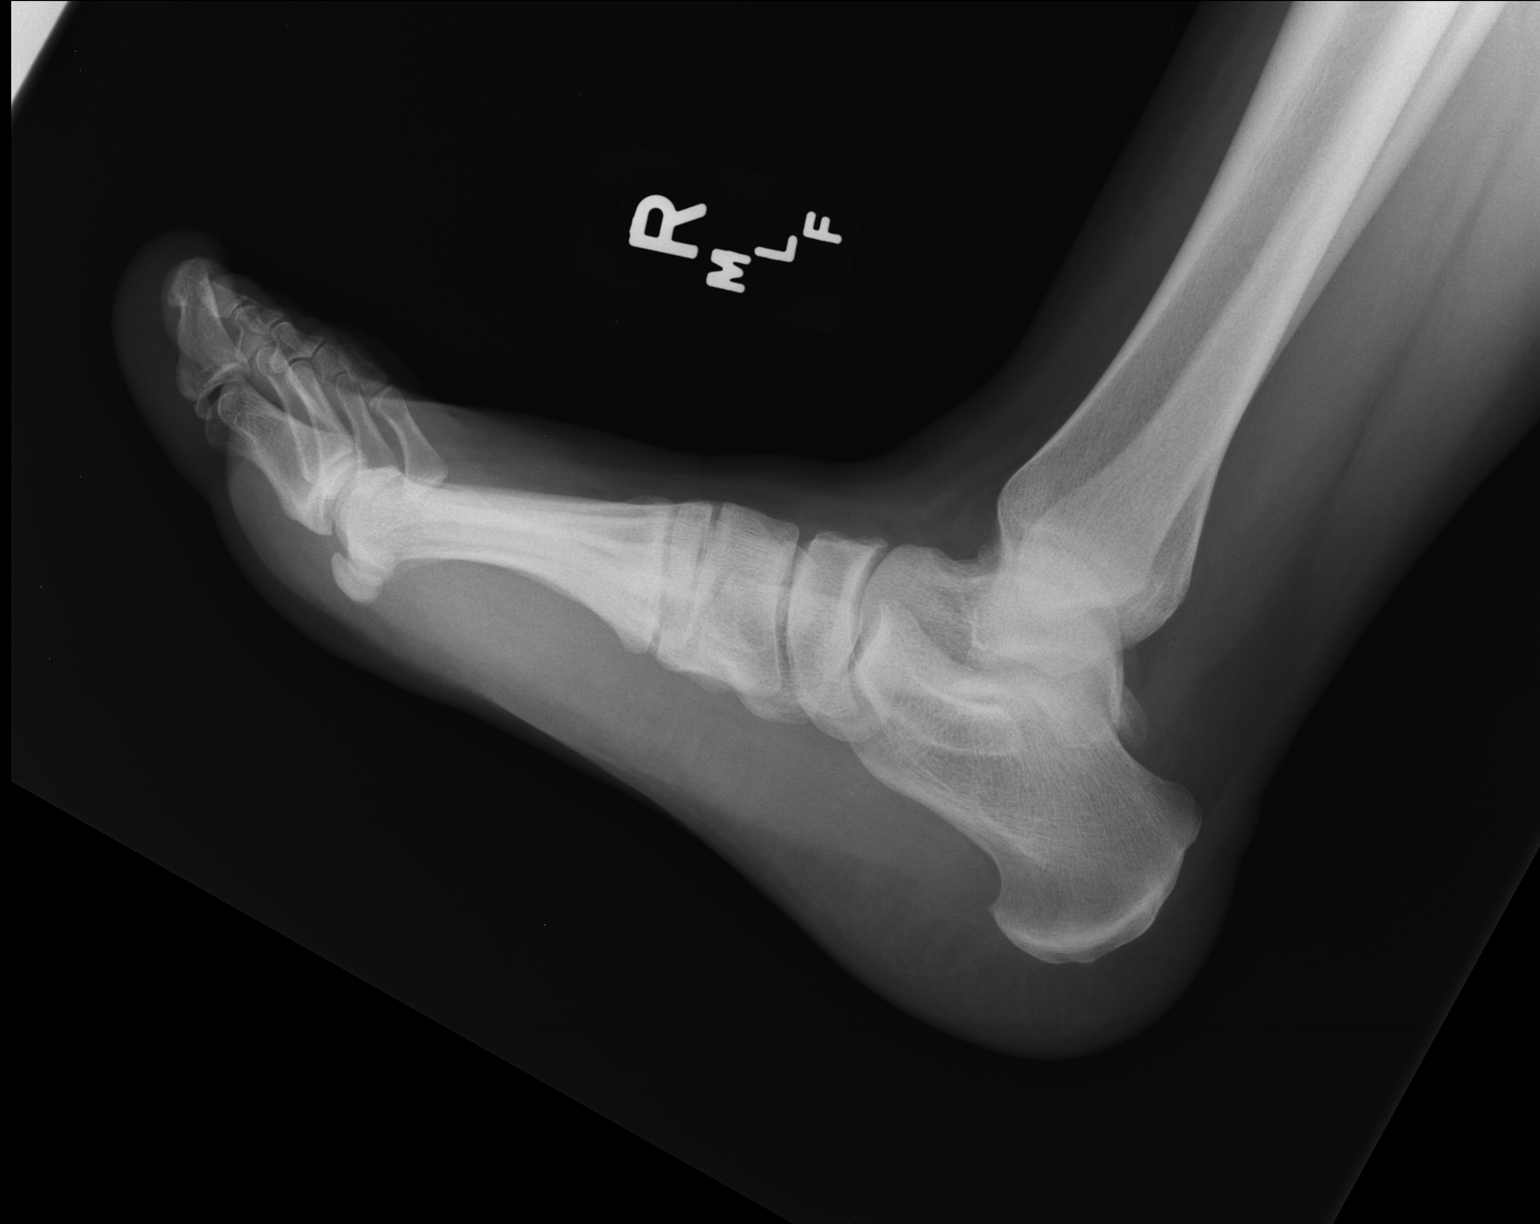

[3 of 3 positions shown; findings below may reference images not displayed]

FINDINGS: Tarsal metatarsal alignment is normal. No fracture is seen. Joint
spaces are normal.
IMPRESSION: Negative.

## 2014-10-11 ENCOUNTER — Ambulatory Visit (INDEPENDENT_AMBULATORY_CARE_PROVIDER_SITE_OTHER): Payer: Commercial Managed Care - HMO | Admitting: Family Medicine

## 2014-10-11 ENCOUNTER — Ambulatory Visit (INDEPENDENT_AMBULATORY_CARE_PROVIDER_SITE_OTHER): Payer: Commercial Managed Care - HMO

## 2014-10-11 VITALS — BP 120/80 | HR 70 | Temp 97.9°F | Resp 16 | Ht 62.0 in | Wt 212.0 lb

## 2014-10-11 DIAGNOSIS — M79671 Pain in right foot: Secondary | ICD-10-CM

## 2014-10-11 DIAGNOSIS — S93401A Sprain of unspecified ligament of right ankle, initial encounter: Secondary | ICD-10-CM

## 2014-10-11 DIAGNOSIS — M25571 Pain in right ankle and joints of right foot: Secondary | ICD-10-CM | POA: Diagnosis not present

## 2014-10-11 IMAGING — CR DG ANKLE COMPLETE 3+V*R*
3 series · 3 of 3 positions shown · non-contrast
Comparison: None.

CLINICAL DATA: Ankle pain for several days, fell down stairs

EXAM:
RIGHT ANKLE - COMPLETE 3+ VIEW

[AP]
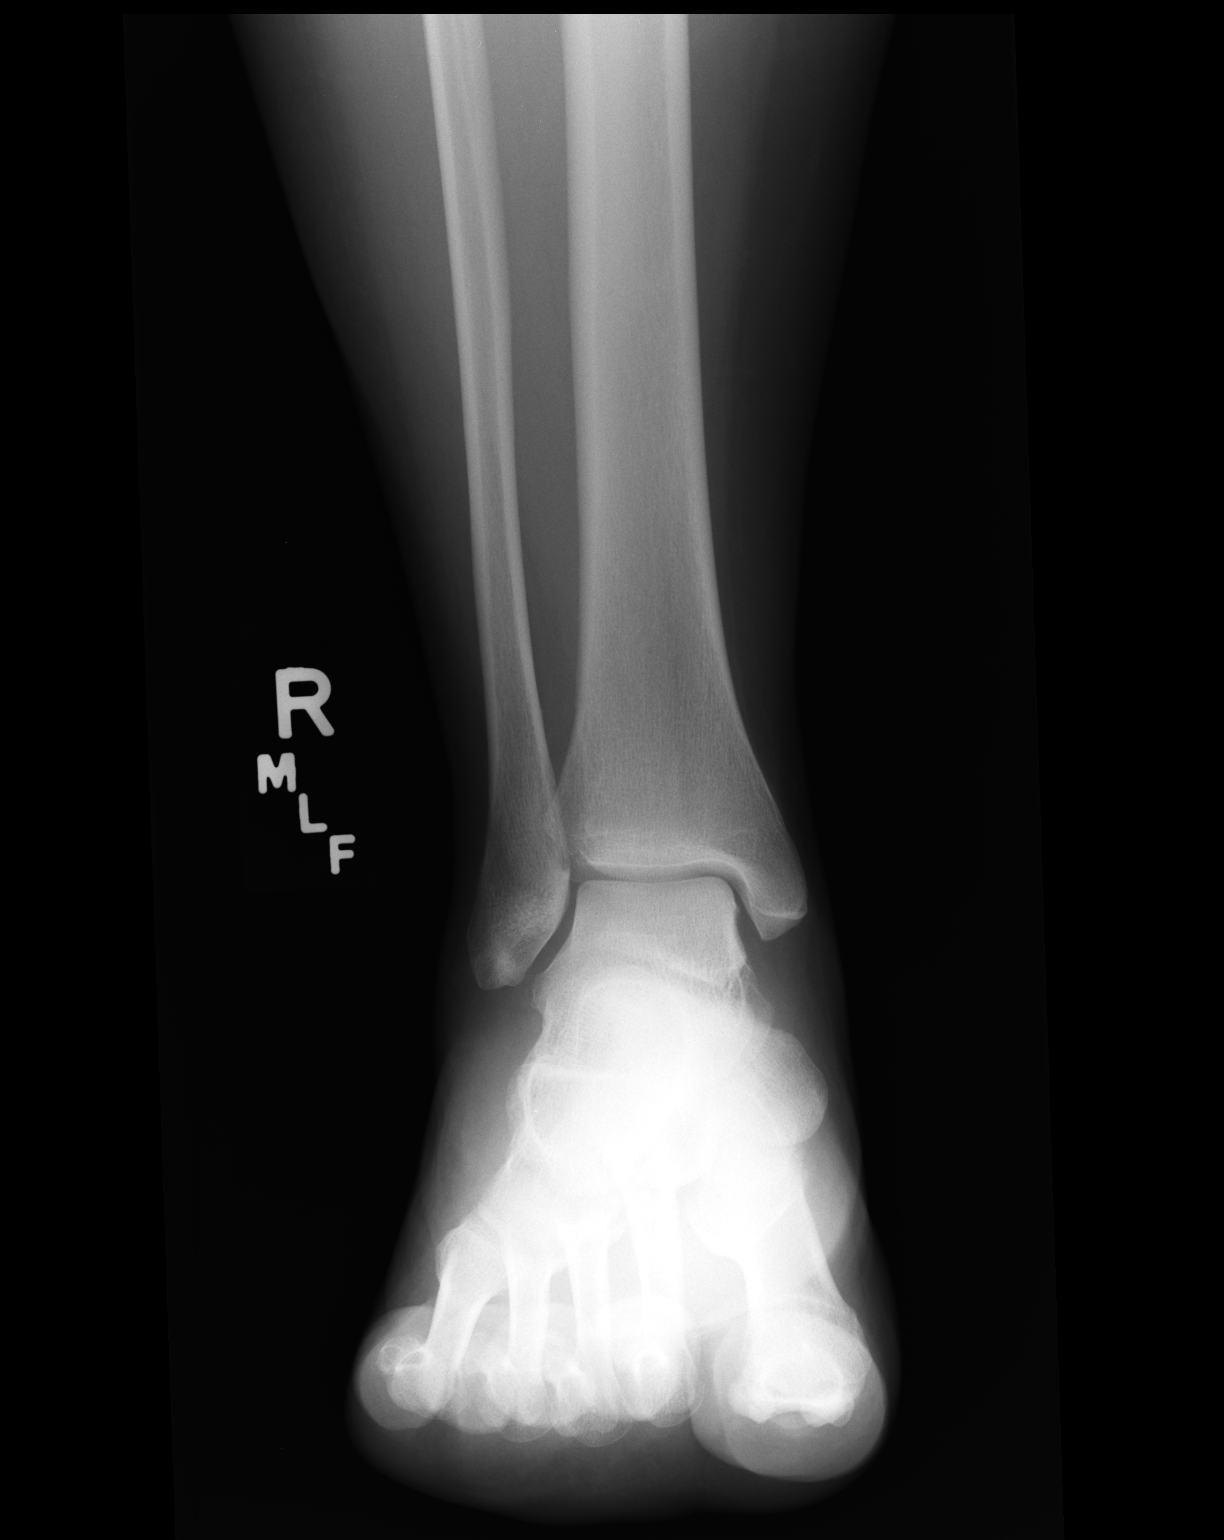

[ap obl int rot]
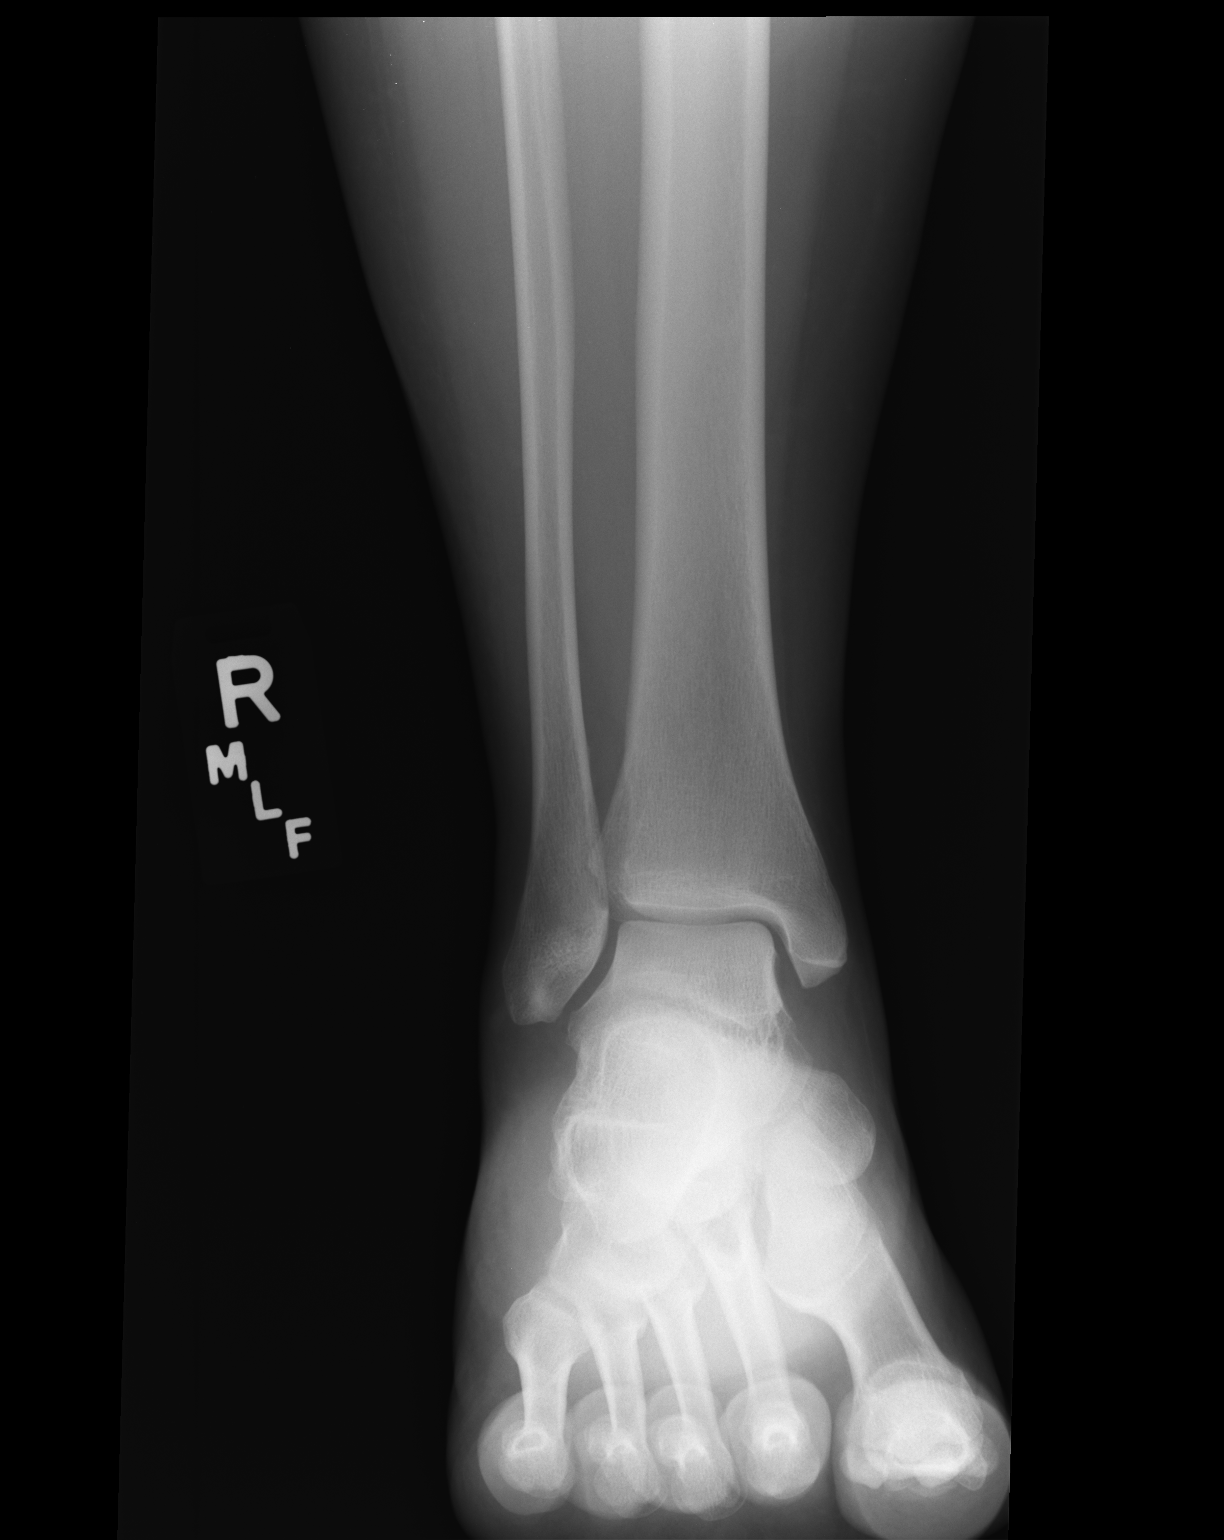

[ap obl ext rot]
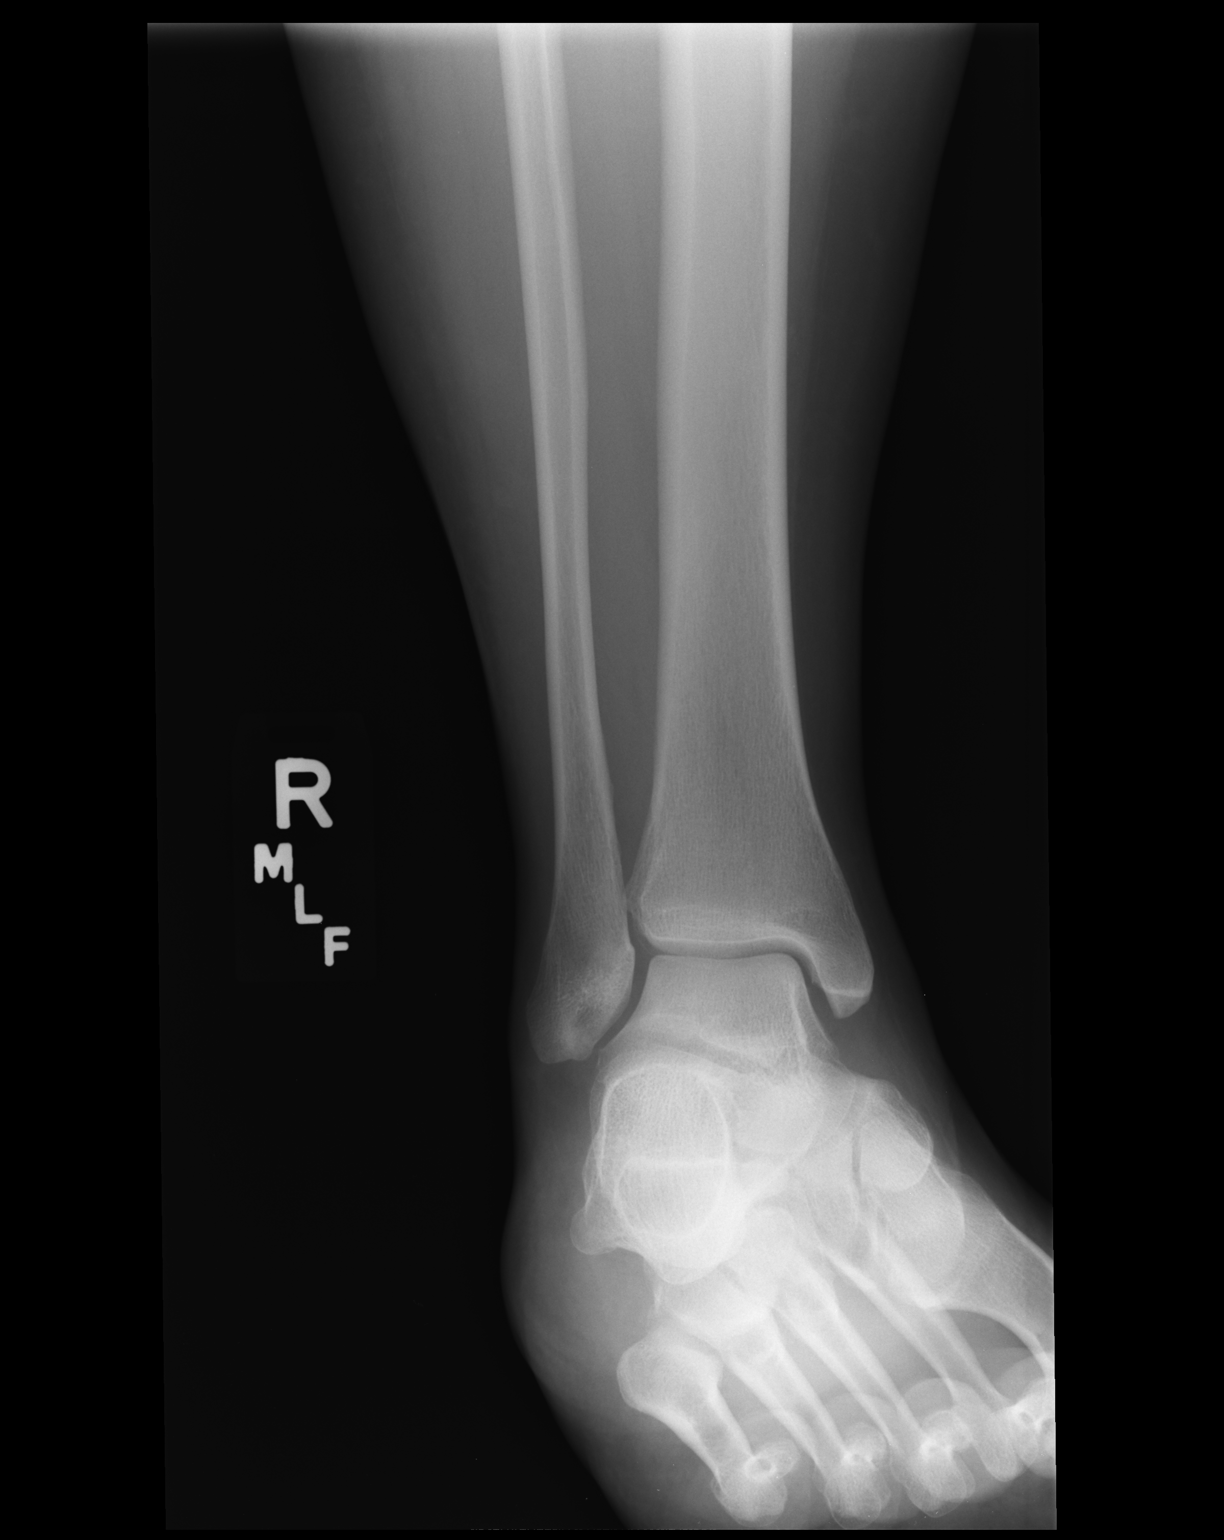

[3 of 3 positions shown; findings below may reference images not displayed]

FINDINGS: The ankle joint appears normal. Alignment is normal. No fracture is
seen.
IMPRESSION: Negative.

## 2014-10-11 NOTE — Progress Notes (Signed)
Subjective:    Patient ID: Regina Mason, female    DOB: Jan 17, 1981, 34 y.o.   MRN: [id number]  HPI Regina Mason is a 34 y.o. female  Presents with R foot pain for past 2 days. Did have a fall going up the stairs 3 days ago, but no initial pain.  Noted pain in R foot the next morning. No known swelling. Able to WB, just sore. Pain outside of ankle and inside of foot.   Tx: ibuprofen - once, min relief. Keeping elevated.     There are no active problems to display for this patient.  Past Medical History  Diagnosis Date  . Complication of anesthesia   . PONV (postoperative nausea and vomiting)   . No pertinent past medical history   . Postpartum care following vaginal delivery 04/05/2011   Past Surgical History  Procedure Laterality Date  . Cesarean section     Not on File Prior to Admission medications   Medication Sig Start Date End Date Taking? Authorizing Provider  pantoprazole (PROTONIX) 20 MG tablet Take 1 tablet (20 mg total) by mouth daily. 04/27/11 04/26/12  Lorre Nick, MD  sucralfate (CARAFATE) 1 G tablet Take 1 tablet (1 g total) by mouth 4 (four) times daily. 04/27/11 04/26/12  Lorre Nick, MD   History   Social History  . Marital Status: Married    Spouse Name: N/A  . Number of Children: N/A  . Years of Education: N/A   Occupational History  . Not on file.   Social History Main Topics  . Smoking status: Never Smoker   . Smokeless tobacco: Not on file  . Alcohol Use: No  . Drug Use: No  . Sexual Activity: Yes   Other Topics Concern  . Not on file   Social History Narrative       Review of Systems  Constitutional: Negative for fever and chills.  Musculoskeletal: Positive for arthralgias. Negative for myalgias and joint swelling.  Skin: Negative for color change and rash.       Objective:   Physical Exam  Constitutional: She is oriented to person, place, and time. She appears well-developed and well-nourished. No distress.  HENT:    Head: Normocephalic and atraumatic.  Eyes: EOM are normal. Pupils are equal, round, and reactive to light.  Pulmonary/Chest: Effort normal.  Musculoskeletal:       Right ankle: She exhibits swelling (minimal lateral ankle). She exhibits normal range of motion, no ecchymosis, no deformity and normal pulse (nvi distally). Tenderness (diffuse lateral ankle, medial malleolus, 1st MT, navicula and proximal 5th MT.  ). Medial malleolus, AITFL, CF ligament and head of 5th metatarsal tenderness found. No proximal fibula tenderness found. Achilles tendon normal.  Negative kleiger, negative talar tilt, min pain with drawer but no laxity appreciated.   Neurological: She is alert and oriented to person, place, and time.  Skin: Skin is warm and dry.  Psychiatric: She has a normal mood and affect.   Filed Vitals:   10/11/14 0853  BP: 120/80  Pulse: 70  Temp: 97.9 F (36.6 C)  TempSrc: Oral  Resp: 16  Height: 5\' 2"  (1.575 m)  Weight: 212 lb (96.163 kg)  SpO2: 99%   [facility] reading (PRIMARY) by  Dr. Neva Seat: Right ankle, foot. No apparent fracture or acute bony findings.        Assessment & Plan:  Neelam Tiggs is a 34 y.o. female Right ankle pain - Plan: DG Ankle Complete Right, DG Foot Complete Right  Right foot pain - Plan: DG Ankle Complete Right, DG Foot Complete Right  Sprain of ankle, right, initial encounter  Suspected right ankle sprain with associated foot pain. No initial pain with injury, lessened chance of fracture or significant injury with initial ability to weight-bear. No concerning findings on x-ray. Start Swede-O ankle brace. HEP by handout, symptomatic care discussed. Recheck next 1-2 weeks if not improving. Sooner if worse   No orders of the defined types were placed in this encounter.   Patient Instructions  I do not see any broken bones on your x-ray. I suspect you have a ankle sprain. See information on treatment below, but can use lace up ankle brace as needed for  the next week or two.  Make sure you are moving ankle throughout the day to lessen chance of stiffness or weakness. If not improving the next 1-2 weeks return to recheck, or sooner if worse.  Acute Ankle Sprain with Phase I Rehab An acute ankle sprain is a partial or complete tear in one or more of the ligaments of the ankle due to traumatic injury. The severity of the injury depends on both the number of ligaments sprained and the grade of sprain. There are 3 grades of sprains.   A grade 1 sprain is a mild sprain. There is a slight pull without obvious tearing. There is no loss of strength, and the muscle and ligament are the correct length.  A grade 2 sprain is a moderate sprain. There is tearing of fibers within the substance of the ligament where it connects two bones or two cartilages. The length of the ligament is increased, and there is usually decreased strength.  A grade 3 sprain is a complete rupture of the ligament and is uncommon. In addition to the grade of sprain, there are three types of ankle sprains.  Lateral ankle sprains: This is a sprain of one or more of the three ligaments on the outer side (lateral) of the ankle. These are the most common sprains. Medial ankle sprains: There is one large triangular ligament of the inner side (medial) of the ankle that is susceptible to injury. Medial ankle sprains are less common. Syndesmosis, "high ankle," sprains: The syndesmosis is the ligament that connects the two bones of the lower leg. Syndesmosis sprains usually only occur with very severe ankle sprains. SYMPTOMS  Pain, tenderness, and swelling in the ankle, starting at the side of injury that may progress to the whole ankle and foot with time.  "Pop" or tearing sensation at the time of injury.  Bruising that may spread to the heel.  Impaired ability to walk soon after injury. CAUSES   Acute ankle sprains are caused by trauma placed on the ankle that temporarily forces or  pries the anklebone (talus) out of its normal socket.  Stretching or tearing of the ligaments that normally hold the joint in place (usually due to a twisting injury). RISK INCREASES WITH:  Previous ankle sprain.  Sports in which the foot may land awkwardly (i.e., basketball, volleyball, or soccer) or walking or running on uneven or rough surfaces.  Shoes with inadequate support to prevent sideways motion when stress occurs.  Poor strength and flexibility.  Poor balance skills.  Contact sports. PREVENTION   Warm up and stretch properly before activity.  Maintain physical fitness:  Ankle and leg flexibility, muscle strength, and endurance.  Cardiovascular fitness.  Balance training activities.  Use proper technique and have a coach correct improper technique.  Taping,  protective strapping, bracing, or high-top tennis shoes may help prevent injury. Initially, tape is best; however, it loses most of its support function within 10 to 15 minutes.  Wear proper-fitted protective shoes (High-top shoes with taping or bracing is more effective than either alone).  Provide the ankle with support during sports and practice activities for 12 months following injury. PROGNOSIS   If treated properly, ankle sprains can be expected to recover completely; however, the length of recovery depends on the degree of injury.  A grade 1 sprain usually heals enough in 5 to 7 days to allow modified activity and requires an average of 6 weeks to heal completely.  A grade 2 sprain requires 6 to 10 weeks to heal completely.  A grade 3 sprain requires 12 to 16 weeks to heal.  A syndesmosis sprain often takes more than 3 months to heal. RELATED COMPLICATIONS   Frequent recurrence of symptoms may result in a chronic problem. Appropriately addressing the problem the first time decreases the frequency of recurrence and optimizes healing time. Severity of the initial sprain does not predict the  likelihood of later instability.  Injury to other structures (bone, cartilage, or tendon).  A chronically unstable or arthritic ankle joint is a possibility with repeated sprains. TREATMENT Treatment initially involves the use of ice, medication, and compression bandages to help reduce pain and inflammation. Ankle sprains are usually immobilized in a walking cast or boot to allow for healing. Crutches may be recommended to reduce pressure on the injury. After immobilization, strengthening and stretching exercises may be necessary to regain strength and a full range of motion. Surgery is rarely needed to treat ankle sprains. MEDICATION   Nonsteroidal anti-inflammatory medications, such as aspirin and ibuprofen (do not take for the first 3 days after injury or within 7 days before surgery), or other minor pain relievers, such as acetaminophen, are often recommended. Take these as directed by your caregiver. Contact your caregiver immediately if any bleeding, stomach upset, or signs of an allergic reaction occur from these medications.  Ointments applied to the skin may be helpful.  Pain relievers may be prescribed as necessary by your caregiver. Do not take prescription pain medication for longer than 4 to 7 days. Use only as directed and only as much as you need. HEAT AND COLD  Cold treatment (icing) is used to relieve pain and reduce inflammation for acute and chronic cases. Cold should be applied for 10 to 15 minutes every 2 to 3 hours for inflammation and pain and immediately after any activity that aggravates your symptoms. Use ice packs or an ice massage.  Heat treatment may be used before performing stretching and strengthening activities prescribed by your caregiver. Use a heat pack or a warm soak. SEEK IMMEDIATE MEDICAL CARE IF:   Pain, swelling, or bruising worsens despite treatment.  You experience pain, numbness, discoloration, or coldness in the foot or toes.  New, unexplained  symptoms develop (drugs used in treatment may produce side effects.) EXERCISES  PHASE I EXERCISES RANGE OF MOTION (ROM) AND STRETCHING EXERCISES - Ankle Sprain, Acute Phase I, Weeks 1 to 2 These exercises may help you when beginning to restore flexibility in your ankle. You will likely work on these exercises for the 1 to 2 weeks after your injury. Once your physician, physical therapist, or athletic trainer sees adequate progress, he or she will advance your exercises. While completing these exercises, remember:   Restoring tissue flexibility helps normal motion to return to the joints.  This allows healthier, less painful movement and activity.  An effective stretch should be held for at least 30 seconds.  A stretch should never be painful. You should only feel a gentle lengthening or release in the stretched tissue. RANGE OF MOTION - Dorsi/Plantar Flexion  While sitting with your right / left knee straight, draw the top of your foot upwards by flexing your ankle. Then reverse the motion, pointing your toes downward.  Hold each position for __________ seconds.  After completing your first set of exercises, repeat this exercise with your knee bent. Repeat __________ times. Complete this exercise __________ times per day.  RANGE OF MOTION - Ankle Alphabet  Imagine your right / left big toe is a pen.  Keeping your hip and knee still, write out the entire alphabet with your "pen." Make the letters as large as you can without increasing any discomfort. Repeat __________ times. Complete this exercise __________ times per day.  STRENGTHENING EXERCISES - Ankle Sprain, Acute -Phase I, Weeks 1 to 2 These exercises may help you when beginning to restore strength in your ankle. You will likely work on these exercises for 1 to 2 weeks after your injury. Once your physician, physical therapist, or athletic trainer sees adequate progress, he or she will advance your exercises. While completing these  exercises, remember:   Muscles can gain both the endurance and the strength needed for everyday activities through controlled exercises.  Complete these exercises as instructed by your physician, physical therapist, or athletic trainer. Progress the resistance and repetitions only as guided.  You may experience muscle soreness or fatigue, but the pain or discomfort you are trying to eliminate should never worsen during these exercises. If this pain does worsen, stop and make certain you are following the directions exactly. If the pain is still present after adjustments, discontinue the exercise until you can discuss the trouble with your clinician. STRENGTH - Dorsiflexors  Secure a rubber exercise band/tubing to a fixed object (i.e., table, pole) and loop the other end around your right / left foot.  Sit on the floor facing the fixed object. The band/tubing should be slightly tense when your foot is relaxed.  Slowly draw your foot back toward you using your ankle and toes.  Hold this position for __________ seconds. Slowly release the tension in the band and return your foot to the starting position. Repeat __________ times. Complete this exercise __________ times per day.  STRENGTH - Plantar-flexors   Sit with your right / left leg extended. Holding onto both ends of a rubber exercise band/tubing, loop it around the ball of your foot. Keep a slight tension in the band.  Slowly push your toes away from you, pointing them downward.  Hold this position for __________ seconds. Return slowly, controlling the tension in the band/tubing. Repeat __________ times. Complete this exercise __________ times per day.  STRENGTH - Ankle Eversion  Secure one end of a rubber exercise band/tubing to a fixed object (table, pole). Loop the other end around your foot just before your toes.  Place your fists between your knees. This will focus your strengthening at your ankle.  Drawing the band/tubing  across your opposite foot, slowly, pull your little toe out and up. Make sure the band/tubing is positioned to resist the entire motion.  Hold this position for __________ seconds. Have your muscles resist the band/tubing as it slowly pulls your foot back to the starting position.  Repeat __________ times. Complete this exercise __________ times per  day.  STRENGTH - Ankle Inversion  Secure one end of a rubber exercise band/tubing to a fixed object (table, pole). Loop the other end around your foot just before your toes.  Place your fists between your knees. This will focus your strengthening at your ankle.  Slowly, pull your big toe up and in, making sure the band/tubing is positioned to resist the entire motion.  Hold this position for __________ seconds.  Have your muscles resist the band/tubing as it slowly pulls your foot back to the starting position. Repeat __________ times. Complete this exercises __________ times per day.  STRENGTH - Towel Curls  Sit in a chair positioned on a non-carpeted surface.  Place your right / left foot on a towel, keeping your heel on the floor.  Pull the towel toward your heel by only curling your toes. Keep your heel on the floor.  If instructed by your physician, physical therapist, or athletic trainer, add weight to the end of the towel. Repeat __________ times. Complete this exercise __________ times per day. Document Released: 10/03/2004 Document Revised: 07/19/2013 Document Reviewed: 06/16/2008 [facility] Patient Information 2015 [place], [place]. This information is not intended to replace advice given to you by your health care provider. Make sure you discuss any questions you have with your health care provider.

## 2014-10-11 NOTE — Patient Instructions (Signed)
I do not see any broken bones on your x-ray. I suspect you have a ankle sprain. See information on treatment below, but can use lace up ankle brace as needed for the next week or two.  Make sure you are moving ankle throughout the day to lessen chance of stiffness or weakness. If not improving the next 1-2 weeks return to recheck, or sooner if worse.  Acute Ankle Sprain with Phase I Rehab An acute ankle sprain is a partial or complete tear in one or more of the ligaments of the ankle due to traumatic injury. The severity of the injury depends on both the number of ligaments sprained and the grade of sprain. There are 3 grades of sprains.   A grade 1 sprain is a mild sprain. There is a slight pull without obvious tearing. There is no loss of strength, and the muscle and ligament are the correct length.  A grade 2 sprain is a moderate sprain. There is tearing of fibers within the substance of the ligament where it connects two bones or two cartilages. The length of the ligament is increased, and there is usually decreased strength.  A grade 3 sprain is a complete rupture of the ligament and is uncommon. In addition to the grade of sprain, there are three types of ankle sprains.  Lateral ankle sprains: This is a sprain of one or more of the three ligaments on the outer side (lateral) of the ankle. These are the most common sprains. Medial ankle sprains: There is one large triangular ligament of the inner side (medial) of the ankle that is susceptible to injury. Medial ankle sprains are less common. Syndesmosis, "high ankle," sprains: The syndesmosis is the ligament that connects the two bones of the lower leg. Syndesmosis sprains usually only occur with very severe ankle sprains. SYMPTOMS  Pain, tenderness, and swelling in the ankle, starting at the side of injury that may progress to the whole ankle and foot with time.  "Pop" or tearing sensation at the time of injury.  Bruising that may spread to  the heel.  Impaired ability to walk soon after injury. CAUSES   Acute ankle sprains are caused by trauma placed on the ankle that temporarily forces or pries the anklebone (talus) out of its normal socket.  Stretching or tearing of the ligaments that normally hold the joint in place (usually due to a twisting injury). RISK INCREASES WITH:  Previous ankle sprain.  Sports in which the foot may land awkwardly (i.e., basketball, volleyball, or soccer) or walking or running on uneven or rough surfaces.  Shoes with inadequate support to prevent sideways motion when stress occurs.  Poor strength and flexibility.  Poor balance skills.  Contact sports. PREVENTION   Warm up and stretch properly before activity.  Maintain physical fitness:  Ankle and leg flexibility, muscle strength, and endurance.  Cardiovascular fitness.  Balance training activities.  Use proper technique and have a coach correct improper technique.  Taping, protective strapping, bracing, or high-top tennis shoes may help prevent injury. Initially, tape is best; however, it loses most of its support function within 10 to 15 minutes.  Wear proper-fitted protective shoes (High-top shoes with taping or bracing is more effective than either alone).  Provide the ankle with support during sports and practice activities for 12 months following injury. PROGNOSIS   If treated properly, ankle sprains can be expected to recover completely; however, the length of recovery depends on the degree of injury.  A grade 1  sprain usually heals enough in 5 to 7 days to allow modified activity and requires an average of 6 weeks to heal completely.  A grade 2 sprain requires 6 to 10 weeks to heal completely.  A grade 3 sprain requires 12 to 16 weeks to heal.  A syndesmosis sprain often takes more than 3 months to heal. RELATED COMPLICATIONS   Frequent recurrence of symptoms may result in a chronic problem. Appropriately  addressing the problem the first time decreases the frequency of recurrence and optimizes healing time. Severity of the initial sprain does not predict the likelihood of later instability.  Injury to other structures (bone, cartilage, or tendon).  A chronically unstable or arthritic ankle joint is a possibility with repeated sprains. TREATMENT Treatment initially involves the use of ice, medication, and compression bandages to help reduce pain and inflammation. Ankle sprains are usually immobilized in a walking cast or boot to allow for healing. Crutches may be recommended to reduce pressure on the injury. After immobilization, strengthening and stretching exercises may be necessary to regain strength and a full range of motion. Surgery is rarely needed to treat ankle sprains. MEDICATION   Nonsteroidal anti-inflammatory medications, such as aspirin and ibuprofen (do not take for the first 3 days after injury or within 7 days before surgery), or other minor pain relievers, such as acetaminophen, are often recommended. Take these as directed by your caregiver. Contact your caregiver immediately if any bleeding, stomach upset, or signs of an allergic reaction occur from these medications.  Ointments applied to the skin may be helpful.  Pain relievers may be prescribed as necessary by your caregiver. Do not take prescription pain medication for longer than 4 to 7 days. Use only as directed and only as much as you need. HEAT AND COLD  Cold treatment (icing) is used to relieve pain and reduce inflammation for acute and chronic cases. Cold should be applied for 10 to 15 minutes every 2 to 3 hours for inflammation and pain and immediately after any activity that aggravates your symptoms. Use ice packs or an ice massage.  Heat treatment may be used before performing stretching and strengthening activities prescribed by your caregiver. Use a heat pack or a warm soak. SEEK IMMEDIATE MEDICAL CARE IF:    Pain, swelling, or bruising worsens despite treatment.  You experience pain, numbness, discoloration, or coldness in the foot or toes.  New, unexplained symptoms develop (drugs used in treatment may produce side effects.) EXERCISES  PHASE I EXERCISES RANGE OF MOTION (ROM) AND STRETCHING EXERCISES - Ankle Sprain, Acute Phase I, Weeks 1 to 2 These exercises may help you when beginning to restore flexibility in your ankle. You will likely work on these exercises for the 1 to 2 weeks after your injury. Once your physician, physical therapist, or athletic trainer sees adequate progress, he or she will advance your exercises. While completing these exercises, remember:   Restoring tissue flexibility helps normal motion to return to the joints. This allows healthier, less painful movement and activity.  An effective stretch should be held for at least 30 seconds.  A stretch should never be painful. You should only feel a gentle lengthening or release in the stretched tissue. RANGE OF MOTION - Dorsi/Plantar Flexion  While sitting with your right / left knee straight, draw the top of your foot upwards by flexing your ankle. Then reverse the motion, pointing your toes downward.  Hold each position for __________ seconds.  After completing your first set of exercises,  repeat this exercise with your knee bent. Repeat __________ times. Complete this exercise __________ times per day.  RANGE OF MOTION - Ankle Alphabet  Imagine your right / left big toe is a pen.  Keeping your hip and knee still, write out the entire alphabet with your "pen." Make the letters as large as you can without increasing any discomfort. Repeat __________ times. Complete this exercise __________ times per day.  STRENGTHENING EXERCISES - Ankle Sprain, Acute -Phase I, Weeks 1 to 2 These exercises may help you when beginning to restore strength in your ankle. You will likely work on these exercises for 1 to 2 weeks after  your injury. Once your physician, physical therapist, or athletic trainer sees adequate progress, he or she will advance your exercises. While completing these exercises, remember:   Muscles can gain both the endurance and the strength needed for everyday activities through controlled exercises.  Complete these exercises as instructed by your physician, physical therapist, or athletic trainer. Progress the resistance and repetitions only as guided.  You may experience muscle soreness or fatigue, but the pain or discomfort you are trying to eliminate should never worsen during these exercises. If this pain does worsen, stop and make certain you are following the directions exactly. If the pain is still present after adjustments, discontinue the exercise until you can discuss the trouble with your clinician. STRENGTH - Dorsiflexors  Secure a rubber exercise band/tubing to a fixed object (i.e., table, pole) and loop the other end around your right / left foot.  Sit on the floor facing the fixed object. The band/tubing should be slightly tense when your foot is relaxed.  Slowly draw your foot back toward you using your ankle and toes.  Hold this position for __________ seconds. Slowly release the tension in the band and return your foot to the starting position. Repeat __________ times. Complete this exercise __________ times per day.  STRENGTH - Plantar-flexors   Sit with your right / left leg extended. Holding onto both ends of a rubber exercise band/tubing, loop it around the ball of your foot. Keep a slight tension in the band.  Slowly push your toes away from you, pointing them downward.  Hold this position for __________ seconds. Return slowly, controlling the tension in the band/tubing. Repeat __________ times. Complete this exercise __________ times per day.  STRENGTH - Ankle Eversion  Secure one end of a rubber exercise band/tubing to a fixed object (table, pole). Loop the other end  around your foot just before your toes.  Place your fists between your knees. This will focus your strengthening at your ankle.  Drawing the band/tubing across your opposite foot, slowly, pull your little toe out and up. Make sure the band/tubing is positioned to resist the entire motion.  Hold this position for __________ seconds. Have your muscles resist the band/tubing as it slowly pulls your foot back to the starting position.  Repeat __________ times. Complete this exercise __________ times per day.  STRENGTH - Ankle Inversion  Secure one end of a rubber exercise band/tubing to a fixed object (table, pole). Loop the other end around your foot just before your toes.  Place your fists between your knees. This will focus your strengthening at your ankle.  Slowly, pull your big toe up and in, making sure the band/tubing is positioned to resist the entire motion.  Hold this position for __________ seconds.  Have your muscles resist the band/tubing as it slowly pulls your foot back to the  starting position. Repeat __________ times. Complete this exercises __________ times per day.  STRENGTH - Towel Curls  Sit in a chair positioned on a non-carpeted surface.  Place your right / left foot on a towel, keeping your heel on the floor.  Pull the towel toward your heel by only curling your toes. Keep your heel on the floor.  If instructed by your physician, physical therapist, or athletic trainer, add weight to the end of the towel. Repeat __________ times. Complete this exercise __________ times per day. Document Released: 10/03/2004 Document Revised: 07/19/2013 Document Reviewed: 06/16/2008 [facility] Patient Information 2015 [place], [place]. This information is not intended to replace advice given to you by your health care provider. Make sure you discuss any questions you have with your health care provider.

## 2017-05-27 ENCOUNTER — Telehealth: Payer: Self-pay | Admitting: General Practice

## 2017-05-27 NOTE — Telephone Encounter (Signed)
Called pt regarding her request to establish care with our practice. LVM advising to call and schedule a new pt visit.

## 2017-09-05 ENCOUNTER — Ambulatory Visit (INDEPENDENT_AMBULATORY_CARE_PROVIDER_SITE_OTHER): Payer: 59 | Admitting: Family Medicine

## 2017-09-05 ENCOUNTER — Encounter: Payer: Self-pay | Admitting: Family Medicine

## 2017-09-05 ENCOUNTER — Other Ambulatory Visit (HOSPITAL_COMMUNITY)
Admission: RE | Admit: 2017-09-05 | Discharge: 2017-09-05 | Disposition: A | Discharge status: Home / Self Care | Payer: 59 | Source: Ambulatory Visit | Attending: Family Medicine | Admitting: Family Medicine

## 2017-09-05 VITALS — BP 112/70 | HR 84 | Temp 97.9°F | Ht 62.0 in

## 2017-09-05 DIAGNOSIS — Z13220 Encounter for screening for lipoid disorders: Secondary | ICD-10-CM | POA: Diagnosis not present

## 2017-09-05 DIAGNOSIS — Z124 Encounter for screening for malignant neoplasm of cervix: Secondary | ICD-10-CM

## 2017-09-05 DIAGNOSIS — Z01419 Encounter for gynecological examination (general) (routine) without abnormal findings: Secondary | ICD-10-CM | POA: Insufficient documentation

## 2017-09-05 NOTE — Assessment & Plan Note (Signed)
Well woman Pap completed today Labs ordered however due to power outage she will come back to have these drawn Anticipatory guidance/Risk factor reduction:  Recommend that she follow a healthy diet rich in fruits and vegetables, incorporate regular aerobic exercise.

## 2017-09-05 NOTE — Progress Notes (Signed)
Regina Mason - 37 y.o. female MRN [id number]  Date of birth: 1981/03/04  Subjective Chief Complaint  Patient presents with  . Annual Exam    CPE with PAP    HPI Regina Mason is a 37 y.o. female here today for annual exam with pap.  She has not had CPE or Pap since the birth of her youngest child about 6 years ago.  She has been healthy otherwise as far as she knows.  She does follow a fairly health diet and does walk some for activity.  She has no additional concerns today.    Review of Systems  Constitutional: Negative for chills, fever, malaise/fatigue and weight loss.  HENT: Negative for congestion, ear pain and sore throat.   Eyes: Negative for blurred vision, double vision and pain.  Respiratory: Negative for cough and shortness of breath.   Cardiovascular: Negative for chest pain and palpitations.  Gastrointestinal: Negative for abdominal pain, blood in stool, constipation, heartburn and nausea.  Genitourinary: Negative for dysuria and urgency.  Musculoskeletal: Negative for joint pain and myalgias.  Neurological: Negative for dizziness and headaches.  Endo/Heme/Allergies: Does not bruise/bleed easily.  Psychiatric/Behavioral: Negative for depression. The patient is not nervous/anxious and does not have insomnia.     No Known Allergies  Past Medical History:  Diagnosis Date  . Complication of anesthesia   . No pertinent past medical history   . PONV (postoperative nausea and vomiting)   . Postpartum care following vaginal delivery 04/05/2011    Past Surgical History:  Procedure Laterality Date  . CESAREAN SECTION      Social History   Socioeconomic History  . Marital status: Married    Spouse name: Not on file  . Number of children: Not on file  . Years of education: Not on file  . Highest education level: Not on file  Occupational History  . Not on file  Social Needs  . Financial resource strain: Not on file  . Food insecurity:    Worry: Not on file     Inability: Not on file  . Transportation needs:    Medical: Not on file    Non-medical: Not on file  Tobacco Use  . Smoking status: Never Smoker  . Smokeless tobacco: Never Used  Substance and Sexual Activity  . Alcohol use: No  . Drug use: No  . Sexual activity: Yes  Lifestyle  . Physical activity:    Days per week: Not on file    Minutes per session: Not on file  . Stress: Not on file  Relationships  . Social connections:    Talks on phone: Not on file    Gets together: Not on file    Attends religious service: Not on file    Active member of club or organization: Not on file    Attends meetings of clubs or organizations: Not on file    Relationship status: Not on file  Other Topics Concern  . Not on file  Social History Narrative  . Not on file    Family History  Problem Relation Age of Onset  . Thyroid disease Mother   . Hypertension Father   . Thyroid disease Brother   . Diabetes Maternal Grandmother   . Heart disease Paternal Grandmother   . Sickle cell anemia Brother   . Sleep apnea Brother   . Anesthesia problems Neg Hx   . Hypotension Neg Hx   . Malignant hyperthermia Neg Hx   . Pseudochol deficiency Neg Hx  Health Maintenance  Topic Date Due  . PAP SMEAR  04/02/2001  . INFLUENZA VACCINE  10/16/2017  . TETANUS/TDAP  04/04/2021  . HIV Screening  Completed    ----------------------------------------------------------------------------------------------------------------------------------------------------------------------------------------------------------------- Physical Exam BP 112/70 (BP Location: Left Arm, Patient Position: Sitting, Cuff Size: Normal)   Pulse 84   Temp 97.9 F (36.6 C) (Oral)   Ht 5\' 2"  (1.575 m)   SpO2 98%   BMI 38.78 kg/m   Physical Exam  Constitutional: She is oriented to person, place, and time. She appears well-nourished. No distress.  HENT:  Head: Normocephalic and atraumatic.  Nose: Nose normal.    Mouth/Throat: Oropharynx is clear and moist.  Eyes: Conjunctivae are normal. No scleral icterus.  Neck: Normal range of motion. Neck supple. No thyromegaly present.  Cardiovascular: Normal rate, regular rhythm and normal heart sounds.  Pulmonary/Chest: Effort normal and breath sounds normal.  Abdominal: Soft. Bowel sounds are normal. She exhibits no distension. There is no tenderness. There is no guarding.  Genitourinary: Vagina normal and uterus normal. There is no rash, tenderness or lesion on the right labia. There is no rash, tenderness or lesion on the left labia. Cervix exhibits no motion tenderness, no discharge and no friability. Right adnexum displays no mass, no tenderness and no fullness. Left adnexum displays no mass, no tenderness and no fullness.  Genitourinary Comments: Chaperoned by Reuben Likesonia Gane, LPN  Musculoskeletal: Normal range of motion. She exhibits no edema.  Lymphadenopathy:    She has no cervical adenopathy.  Neurological: She is oriented to person, place, and time.  Skin: Skin is warm and dry. No rash noted.  Psychiatric: She has a normal mood and affect. Her behavior is normal.    ------------------------------------------------------------------------------------------------------------------------------------------------------------------------------------------------------------------- Assessment and Plan  Well woman exam with routine gynecological exam Well woman Pap completed today Labs ordered however due to power outage she will come back to have these drawn Anticipatory guidance/Risk factor reduction:  Recommend that she follow a healthy diet rich in fruits and vegetables, incorporate regular aerobic exercise.

## 2017-09-09 LAB — CYTOLOGY - PAP
Diagnosis: NEGATIVE
HPV: NOT DETECTED

## 2017-09-15 NOTE — Progress Notes (Signed)
Normal pap

## 2019-03-19 DIAGNOSIS — J029 Acute pharyngitis, unspecified: Secondary | ICD-10-CM | POA: Diagnosis not present

## 2019-04-15 ENCOUNTER — Encounter: Payer: Self-pay | Admitting: Family Medicine

## 2019-04-15 ENCOUNTER — Ambulatory Visit (INDEPENDENT_AMBULATORY_CARE_PROVIDER_SITE_OTHER): Payer: BC Managed Care – PPO | Admitting: Family Medicine

## 2019-04-15 ENCOUNTER — Encounter (HOSPITAL_BASED_OUTPATIENT_CLINIC_OR_DEPARTMENT_OTHER): Payer: Self-pay

## 2019-04-15 ENCOUNTER — Other Ambulatory Visit: Payer: Self-pay

## 2019-04-15 ENCOUNTER — Emergency Department (HOSPITAL_BASED_OUTPATIENT_CLINIC_OR_DEPARTMENT_OTHER)
Admission: EM | Admit: 2019-04-15 | Discharge: 2019-04-15 | Disposition: A | Discharge status: Home / Self Care | Payer: BC Managed Care – PPO | Attending: Emergency Medicine | Admitting: Emergency Medicine

## 2019-04-15 VITALS — BP 120/82 | HR 87 | Temp 96.1°F | Ht 62.0 in | Wt 204.2 lb

## 2019-04-15 DIAGNOSIS — Z79899 Other long term (current) drug therapy: Secondary | ICD-10-CM | POA: Insufficient documentation

## 2019-04-15 DIAGNOSIS — N751 Abscess of Bartholin's gland: Secondary | ICD-10-CM | POA: Insufficient documentation

## 2019-04-15 DIAGNOSIS — N750 Cyst of Bartholin's gland: Secondary | ICD-10-CM

## 2019-04-15 DIAGNOSIS — R102 Pelvic and perineal pain: Secondary | ICD-10-CM | POA: Diagnosis not present

## 2019-04-15 MED ORDER — LIDOCAINE-EPINEPHRINE (PF) 2 %-1:200000 IJ SOLN
20.0000 mL | Freq: Once | INTRAMUSCULAR | Status: AC
  Administered 2019-04-15: 20 mL
  Filled 2019-04-15: qty 20

## 2019-04-15 NOTE — ED Provider Notes (Signed)
[facility] EMERGENCY DEPARTMENT Provider Note   CSN: [id number] Arrival date & time: 04/15/19  1207     History Chief Complaint  Patient presents with  . Cyst    Regina Mason is a 39 y.o. female.  She is complaining of a painful lump in her vagina.  She says it has been going on 2 weeks although has been worse over the last few days.  Moderate pain worse with walking.  No fevers or chills.  She saw her primary care doctor who referred her here for drainage.  No prior history of same.  The history is provided by the patient.  Female GU Problem This is a new problem. The current episode started more than 1 week ago. The problem occurs constantly. The problem has been gradually worsening. Pertinent negatives include no chest pain, no abdominal pain, no headaches and no shortness of breath. The symptoms are aggravated by walking. Nothing relieves the symptoms. She has tried acetaminophen for the symptoms. The treatment provided no relief.       Past Medical History:  Diagnosis Date  . Complication of anesthesia   . No pertinent past medical history   . PONV (postoperative nausea and vomiting)   . Postpartum care following vaginal delivery 04/05/2011    Patient Active Problem List   Diagnosis Date Noted  . Well woman exam with routine gynecological exam 09/05/2017    Past Surgical History:  Procedure Laterality Date  . CESAREAN SECTION       OB History    Gravida  5   Para  2   Term  2   Preterm      AB      Living  2     SAB      TAB      Ectopic      Multiple      Live Births  1           Family History  Problem Relation Age of Onset  . Thyroid disease Mother   . Hypertension Father   . Thyroid disease Brother   . Diabetes Maternal Grandmother   . Heart disease Paternal Grandmother   . Sickle cell anemia Brother   . Sleep apnea Brother   . Anesthesia problems Neg Hx   . Hypotension Neg Hx   . Malignant hyperthermia Neg Hx     . Pseudochol deficiency Neg Hx     Social History   Tobacco Use  . Smoking status: Never Smoker  . Smokeless tobacco: Never Used  Substance Use Topics  . Alcohol use: No  . Drug use: No    Home Medications Prior to Admission medications   Medication Sig Start Date End Date Taking? Authorizing Provider  acetaminophen (TYLENOL) 500 MG tablet Take 500 mg by mouth every 6 (six) hours as needed.    [provider]  pantoprazole (PROTONIX) 20 MG tablet Take 1 tablet (20 mg total) by mouth daily. 04/27/11 04/26/12  Lorre Nick, MD  sucralfate (CARAFATE) 1 G tablet Take 1 tablet (1 g total) by mouth 4 (four) times daily. 04/27/11 04/26/12  Lorre Nick, MD    Allergies    Patient has no known allergies.  Review of Systems   Review of Systems  Constitutional: Negative for fever.  HENT: Negative for sore throat.   Respiratory: Negative for shortness of breath.   Cardiovascular: Negative for chest pain.  Gastrointestinal: Negative for abdominal pain.  Genitourinary: Positive for vaginal pain. Negative  for dysuria.  Skin: Negative for rash.  Neurological: Negative for headaches.    Physical Exam Updated Vital Signs BP 127/83 (BP Location: Left Arm)   Pulse 83   Temp 98.8 F (37.1 C) (Oral)   Resp 20   Ht 5\' 2"  (1.575 m)   Wt 92.1 kg   LMP 03/27/2019   SpO2 97%   BMI 37.13 kg/m   Physical Exam Vitals and nursing note reviewed.  Constitutional:      General: She is not in acute distress.    Appearance: She is well-developed.  HENT:     Head: Normocephalic and atraumatic.  Eyes:     Conjunctiva/sclera: Conjunctivae normal.  Cardiovascular:     Rate and Rhythm: Normal rate and regular rhythm.     Heart sounds: No murmur.  Pulmonary:     Effort: Pulmonary effort is normal. No respiratory distress.     Breath sounds: Normal breath sounds.  Abdominal:     Palpations: Abdomen is soft.     Tenderness: There is no abdominal tenderness.  Musculoskeletal:         General: No deformity or signs of injury.     Cervical back: Neck supple.  Skin:    General: Skin is warm and dry.     Capillary Refill: Capillary refill takes less than 2 seconds.  Neurological:     General: No focal deficit present.     Mental Status: She is alert.     ED Results / Procedures / Treatments   Labs (all labs ordered are listed, but only abnormal results are displayed) Labs Reviewed - No data to display  EKG None  Radiology No results found.  Procedures .03/09/2021Incision and Drainage  Date/Time: 04/15/2019 1:11 PM Performed by: 04/17/2019, MD Authorized by: Terrilee Files, MD   Consent:    Consent obtained:  Verbal   Consent given by:  Patient   Risks discussed:  Bleeding, incomplete drainage, pain and infection   Alternatives discussed:  No treatment, delayed treatment and referral Location:    Type:  Bartholin cyst   Size:  3   Location:  Anogenital   Anogenital location:  Bartholin's gland Pre-procedure details:    Skin preparation:  Betadine Anesthesia (see MAR for exact dosages):    Anesthesia method:  Local infiltration   Local anesthetic:  Lidocaine 2% WITH epi Procedure type:    Complexity:  Complex Procedure details:    Incision types:  Single straight   Scalpel blade:  15   Wound management:  Probed and deloculated   Drainage:  Purulent   Drainage amount:  Copious   Wound treatment:  Wound left open Post-procedure details:    Patient tolerance of procedure:  Tolerated well, no immediate complications   (including critical care time)  Medications Ordered in ED Medications  lidocaine-EPINEPHrine (XYLOCAINE W/EPI) 2 %-1:200000 (PF) injection 20 mL (has no administration in time range)    ED Course  I have reviewed the triage vital signs and the nursing notes.  Pertinent labs & imaging results that were available during my care of the patient were reviewed by me and considered in my medical decision making (see chart for  details).    MDM Rules/Calculators/A&P                       Final Clinical Impression(s) / ED Diagnoses Final diagnoses:  Bartholin's gland abscess    Rx / DC Orders ED Discharge Orders  None       Hayden Rasmussen, MD 04/15/19 [phone]

## 2019-04-15 NOTE — ED Triage Notes (Signed)
Pt states she was sent by PCP for tx of cyst to vaginal cyst-PCP notes reads dx bartholin cyst-NAD-steady gait

## 2019-04-15 NOTE — Progress Notes (Addendum)
Regina Mason is a 39 y.o. female  Chief Complaint  Patient presents with  . Cyst    Pt c/o painful bump on rt side of vagina area.  Bump has been there x2 weeks, no redness or fluid just painful to touch.  Pt has been taking Tylenol for the pain.    HPI: Regina Mason is a 39 y.o. female with 2 wk h/o painful bump on Rt labia. She states she took ibuprofen earlier in the week which helped but she notes increased pain, swelling in the past 2 days. No drainage that she is aware of. No fever, chills. Just very tender to touch and it is uncomfortable to sit. No h/o previous similar symptoms.   Past Medical History:  Diagnosis Date  . Complication of anesthesia   . No pertinent past medical history   . PONV (postoperative nausea and vomiting)   . Postpartum care following vaginal delivery 04/05/2011    Past Surgical History:  Procedure Laterality Date  . CESAREAN SECTION      Social History   Socioeconomic History  . Marital status: Married    Spouse name: Not on file  . Number of children: Not on file  . Years of education: Not on file  . Highest education level: Not on file  Occupational History  . Not on file  Tobacco Use  . Smoking status: Never Smoker  . Smokeless tobacco: Never Used  Substance and Sexual Activity  . Alcohol use: No  . Drug use: No  . Sexual activity: Yes  Other Topics Concern  . Not on file  Social History Narrative  . Not on file   Social Determinants of Health   Financial Resource Strain:   . Difficulty of Paying Living Expenses: Not on file  Food Insecurity:   . Worried About Programme researcher, broadcasting/film/video in the Last Year: Not on file  . Ran Out of Food in the Last Year: Not on file  Transportation Needs:   . Lack of Transportation (Medical): Not on file  . Lack of Transportation (Non-Medical): Not on file  Physical Activity:   . Days of Exercise per Week: Not on file  . Minutes of Exercise per Session: Not on file  Stress:   . Feeling  of Stress : Not on file  Social Connections:   . Frequency of Communication with Friends and Family: Not on file  . Frequency of Social Gatherings with Friends and Family: Not on file  . Attends Religious Services: Not on file  . Active Member of Clubs or Organizations: Not on file  . Attends Banker Meetings: Not on file  . Marital Status: Not on file  Intimate Partner Violence:   . Fear of Current or Ex-Partner: Not on file  . Emotionally Abused: Not on file  . Physically Abused: Not on file  . Sexually Abused: Not on file    Family History  Problem Relation Age of Onset  . Thyroid disease Mother   . Hypertension Father   . Thyroid disease Brother   . Diabetes Maternal Grandmother   . Heart disease Paternal Grandmother   . Sickle cell anemia Brother   . Sleep apnea Brother   . Anesthesia problems Neg Hx   . Hypotension Neg Hx   . Malignant hyperthermia Neg Hx   . Pseudochol deficiency Neg Hx      Immunization History  Administered Date(s) Administered  . Tdap 04/05/2011    Outpatient Encounter Medications as  of 04/15/2019  Medication Sig  . acetaminophen (TYLENOL) 500 MG tablet Take 500 mg by mouth every 6 (six) hours as needed.  . pantoprazole (PROTONIX) 20 MG tablet Take 1 tablet (20 mg total) by mouth daily.  . sucralfate (CARAFATE) 1 G tablet Take 1 tablet (1 g total) by mouth 4 (four) times daily.   No facility-administered encounter medications on file as of 04/15/2019.     ROS: Pertinent positives and negatives noted in HPI. Remainder of ROS non-contributory   No Known Allergies  BP 120/82 (BP Location: Left Arm, Patient Position: Sitting, Cuff Size: Normal)   Pulse 87   Temp (!) 96.1 F (35.6 C) (Temporal)   Ht 5\' 2"  (1.575 m)   Wt 204 lb 3.2 oz (92.6 kg)   LMP 03/27/2019   SpO2 99%   BMI 37.35 kg/m   Physical Exam  Constitutional: She is oriented to person, place, and time. She appears well-developed and well-nourished. No distress.   Genitourinary:    There is tenderness and lesion on the right labia. There is no rash on the right labia. There is no rash, tenderness or lesion on the left labia.  Lymphadenopathy:       Right: No inguinal adenopathy present.       Left: No inguinal adenopathy present.  Neurological: She is alert and oriented to person, place, and time.  Skin: She is not diaphoretic.     A/P:  1. Bartholin's cyst - large cyst vs abscess on Rt, location and exam c/w Bartholin - due to size, location, and limited supplies in office, I called 3 GYN offices within [place] to try to get pt an acute (same or next day) appt for eval,. Unfortunately, pt was not able to be accommodated and it was recommended pt go to ER at [facility] for eval/treatment. Pt is agreeable and will proceed there at this time.  This v isit occurred during the SARS-CoV-2 public health emergency.  Safety protocols were in place, including screening questions prior to the visit, additional usage of staff PPE, and extensive cleaning of exam room while observing appropriate contact time as indicated for disinfecting solutions.

## 2019-04-15 NOTE — Discharge Instructions (Addendum)
You were evaluated in the emergency department for an abscess in your vagina.  This is a Bartholin gland.  We opened up the cyst and drained out the infection.  Please continue to use Tylenol and ibuprofen for pain.  Gentle irrigation of that area in shower or bath.  Follow-up with your doctor or return to the emergency department for any worsening symptoms.

## 2019-04-20 ENCOUNTER — Other Ambulatory Visit: Payer: Self-pay

## 2019-04-20 NOTE — Patient Instructions (Signed)
Health Maintenance Due  Topic Date Due  . INFLUENZA VACCINE  10/17/2018    Depression screen [facility] 2/9 09/05/2017 10/11/2014  Decreased Interest 0 0  Down, Depressed, Hopeless 0 0  PHQ - 2 Score 0 0

## 2019-04-21 ENCOUNTER — Encounter: Payer: Self-pay | Admitting: Family Medicine

## 2019-04-21 ENCOUNTER — Ambulatory Visit: Payer: BC Managed Care – PPO | Admitting: Family Medicine

## 2019-04-21 VITALS — BP 98/70 | HR 67 | Temp 96.9°F | Ht 62.0 in | Wt 205.0 lb

## 2019-04-21 DIAGNOSIS — N750 Cyst of Bartholin's gland: Secondary | ICD-10-CM | POA: Diagnosis not present

## 2019-04-21 NOTE — Progress Notes (Signed)
[id number]

## 2019-04-21 NOTE — Progress Notes (Signed)
Regina Mason is a 39 y.o. female  Chief Complaint  Patient presents with  . Follow-up    Cyst.  Pt said that she is feeling a lot bettter.    HPI: Regina Mason is a 39 y.o. female here to f/u on Rt Bartholin's cyst. She was seen by me on 04/15/19 and ultimately sent to [facility] where she has I&D done. Pt states she is feeling so much better. Denies fever, chills, drainage from area, n/v. She endorses minimal pain/pressure.  She would like to establish care with GYN to discuss this recent cyst, likelihood of recurrence, treatment going forward, etc.  Past Medical History:  Diagnosis Date  . Complication of anesthesia   . No pertinent past medical history   . PONV (postoperative nausea and vomiting)   . Postpartum care following vaginal delivery 04/05/2011    Past Surgical History:  Procedure Laterality Date  . CESAREAN SECTION      Social History   Socioeconomic History  . Marital status: Married    Spouse name: Not on file  . Number of children: Not on file  . Years of education: Not on file  . Highest education level: Not on file  Occupational History  . Not on file  Tobacco Use  . Smoking status: Never Smoker  . Smokeless tobacco: Never Used  Substance and Sexual Activity  . Alcohol use: No  . Drug use: No  . Sexual activity: Yes    Birth control/protection: None  Other Topics Concern  . Not on file  Social History Narrative  . Not on file   Social Determinants of Health   Financial Resource Strain:   . Difficulty of Paying Living Expenses: Not on file  Food Insecurity:   . Worried About Charity fundraiser in the Last Year: Not on file  . Ran Out of Food in the Last Year: Not on file  Transportation Needs:   . Lack of Transportation (Medical): Not on file  . Lack of Transportation (Non-Medical): Not on file  Physical Activity:   . Days of Exercise per Week: Not on file  . Minutes of Exercise per Session: Not on file  Stress:   . Feeling of  Stress : Not on file  Social Connections:   . Frequency of Communication with Friends and Family: Not on file  . Frequency of Social Gatherings with Friends and Family: Not on file  . Attends Religious Services: Not on file  . Active Member of Clubs or Organizations: Not on file  . Attends Archivist Meetings: Not on file  . Marital Status: Not on file  Intimate Partner Violence:   . Fear of Current or Ex-Partner: Not on file  . Emotionally Abused: Not on file  . Physically Abused: Not on file  . Sexually Abused: Not on file    Family History  Problem Relation Age of Onset  . Thyroid disease Mother   . Hypertension Father   . Thyroid disease Brother   . Diabetes Maternal Grandmother   . Heart disease Paternal Grandmother   . Sickle cell anemia Brother   . Sleep apnea Brother   . Anesthesia problems Neg Hx   . Hypotension Neg Hx   . Malignant hyperthermia Neg Hx   . Pseudochol deficiency Neg Hx      Immunization History  Administered Date(s) Administered  . Tdap 04/05/2011    Outpatient Encounter Medications as of 04/21/2019  Medication Sig  . acetaminophen (TYLENOL) 500  MG tablet Take 500 mg by mouth every 6 (six) hours as needed.  . pantoprazole (PROTONIX) 20 MG tablet Take 1 tablet (20 mg total) by mouth daily.  . sucralfate (CARAFATE) 1 G tablet Take 1 tablet (1 g total) by mouth 4 (four) times daily.   No facility-administered encounter medications on file as of 04/21/2019.     ROS: Pertinent positives and negatives noted in HPI. Remainder of ROS non-contributory    No Known Allergies  BP 98/70 (BP Location: Left Arm, Patient Position: Sitting, Cuff Size: Large)   Pulse 67   Temp (!) 96.9 F (36.1 C) (Temporal)   Ht 5\' 2"  (1.575 m)   Wt 205 lb (93 kg)   LMP 04/21/2019   SpO2 100%   BMI 37.49 kg/m   Physical Exam  Constitutional: She appears well-developed and well-nourished. No distress.  Genitourinary: There is tenderness (mild TTP and some  subcutaneous firmness/fullness, no fluctuance or indurations) on the right labia. There is no lesion or injury on the right labia. There is no tenderness, lesion or injury on the left labia.  Psychiatric: She has a normal mood and affect. Her behavior is normal.     A/P:  1. Bartholin's cyst - Ambulatory referral to Obstetrics / Gynecology   This visit occurred during the SARS-CoV-2 public health emergency.  Safety protocols were in place, including screening questions prior to the visit, additional usage of staff PPE, and extensive cleaning of exam room while observing appropriate contact time as indicated for disinfecting solutions.

## 2019-04-26 ENCOUNTER — Ambulatory Visit (INDEPENDENT_AMBULATORY_CARE_PROVIDER_SITE_OTHER): Payer: BC Managed Care – PPO | Admitting: Obstetrics & Gynecology

## 2019-04-26 ENCOUNTER — Other Ambulatory Visit: Payer: Self-pay

## 2019-04-26 ENCOUNTER — Encounter: Payer: Self-pay | Admitting: Obstetrics & Gynecology

## 2019-04-26 VITALS — BP 100/79 | HR 82 | Ht 62.0 in | Wt 200.0 lb

## 2019-04-26 DIAGNOSIS — N750 Cyst of Bartholin's gland: Secondary | ICD-10-CM | POA: Diagnosis not present

## 2019-04-26 NOTE — Patient Instructions (Signed)
Bartholin's Cyst  A Bartholin's cyst is a fluid-filled sac that forms on a Bartholin's gland. Bartholin's glands are small glands in the folds of skin around the vaginal opening (labia). These glands produce a fluid to moisten (lubricate) the outside of the vagina during sex. A cyst that is not large or infected may not cause any problems or require treatment. If the cyst gets infected, it is called a Bartholin's abscess. An abscess may cause symptoms such as pain and swelling and is more likely to require treatment. What are the causes? This condition may be caused by a blocked Bartholin's gland. These glands can become blocked due to natural buildup of fluid and oils. Bacteria inside of the cyst can cause infection. In many cases, the cause is not known. What increases the risk? You may be at increased risk of developing a Bartholin's cyst or abscess if:  You are of childbearing age.  You have a history of Bartholin's cysts or abscesses.  You have diabetes.  You have an STI (sexually transmitted infection). What are the signs or symptoms? Symptoms may include:  A bulge or lump on the labia, near the lower opening of the vagina.  Discomfort or pain. This may get worse during sex or when walking.  Redness, swelling, or fluid draining from the area. These may be signs of an abscess. How severe your symptoms are depends on the size of your cyst and whether it is infected. Infection causes symptoms to get more severe. How is this diagnosed? This condition may be diagnosed based on:  Your symptoms and medical history.  A physical exam to check for swelling in your vaginal area. You may lie on your back on an exam table and have your feet placed into footrests for the exam.  Blood tests to check for infections.  Removal of a fluid sample from the cyst or abscess (biopsy) for testing. You may work with a health care provider who specializes in women's health (gynecologist) for diagnosis  and treatment. How is this treated? If your cyst is small, not infected, and not causing symptoms, you may not need any treatment. These cysts often go away on their own, with home care such as hot baths or warm compresses. If you have a large cyst or an abscess, treatment may include:  Antibiotic medicine.  A procedure to drain the fluid inside the cyst or abscess. These procedures involve making an incision in the cyst or abscess so that the fluid drains out, and then one of the following may be done: ? A small, thin tube (catheter) may be placed inside the cyst or abscess so that it does not close and fill up with fluid again (fistulization). The catheter will be removed at a follow-up visit. ? The edges of the incision may be stitched to your skin so that the cyst or abscess stays open (marsupialization). This allows it to continue to drain and not fill up with fluid again. If you have cysts or abscesses that keep returning (recurring) and have required incision and drainage multiple times, your health care provider may talk with you about surgery to remove the Bartholin's gland. Follow these instructions at home: Medicines  Take over-the-counter and prescription medicines only as told by your health care provider.  If you were prescribed an antibiotic medicine, take it as told by your health care provider. Do not stop taking the antibiotic even if your condition improves. Managing pain and swelling  Try sitz baths to help with   pain and swelling. A sitz bath is a warm water bath in which the water only comes up to your hips and should cover your buttocks. You may take sitz baths several times a day.  Apply heat to the affected area as often as needed. Use the heat source that your health care provider recommends, such as a moist heat pack or a heating pad. ? Place a towel between your skin and the heat source. ? Leave the heat on for 20-30 minutes. ? Remove the heat if your skin turns  bright red. This is especially important if you are unable to feel pain, heat, or cold. You may have a greater risk of getting burned. General instructions  If your cyst or abscess was drained, follow instructions from your health care provider about how to take care of your wound. Use feminine pads as needed to absorb any drainage.  Do not push on or squeeze your cyst.  Do not have sex until the cyst has gone away or your wound from drainage has healed.  Take these steps to help prevent a Bartholin's cyst from returning, and to prevent other Bartholin's cysts from developing: ? Take a bath or shower once a day. Clean your vaginal area with mild soap and water when you bathe. ? Practice safe sex to prevent STIs. Talk with your health care provider about how to prevent STIs and which forms of birth control (contraception) may be best for you.  Keep all follow-up visits as told by your health care provider. This is important. Contact a health care provider if:  You have a fever.  You develop redness, swelling, or pain around your cyst.  You have fluid, blood, pus, or a bad smell coming from your cyst.  You have a cyst that gets larger or comes back. Summary  A Bartholin's cyst is a fluid-filled sac that forms on a Bartholin's gland. These glands are in the folds of skin around the vaginal opening (labia).  If your cyst is small, not infected, and not causing symptoms, you may not need any treatment. These cysts often go away on their own, with home care such as hot baths or warm compresses.  If you have a large cyst or an abscess, your health care provider may perform a procedure to drain the fluid.  If you have cysts or abscesses that keep returning (recurring) and have required incision and drainage multiple times, your health care provider may talk with you about surgery to remove the Bartholin's gland. This information is not intended to replace advice given to you by your health  care provider. Make sure you discuss any questions you have with your health care provider. Document Revised: 12/25/2017 Document Reviewed: 12/04/2016 Elsevier Patient Education  2020 Elsevier Inc[employer]  

## 2019-04-26 NOTE — Progress Notes (Signed)
History:  39 y.o. [id number] here today for eval of right Bartholins gland cyst.  The catheter is out and there is no pain at present.   The following portions of the patient's history were reviewed and updated as appropriate: allergies, current medications, past family history, past medical history, past social history, past surgical history and problem list.  Review of Systems:  Pertinent items are noted in HPI.    Objective:  Physical Exam Blood pressure 100/79, pulse 82, height 5\' 2"  (1.575 m), weight 200 lb (90.7 kg), last menstrual period 04/21/2019. Gen: NAD Abd: Soft, nontender and nondistended Pelvic: Normal appearing external genitalia; normal appearing vaginal mucosa and cervix.  Normal discharge.  Small uterus, no other palpable masses, no uterine or adnexal tenderness GU: EGBUS: no lesions; no evidence of Bartholnis cyst or abscess  Vagina: no blood in vault Cervix: no lesion; no mucopurulent d/c Uterus: small, mobile Adnexa: no masses; sl tender     Assessment & Plan:  Bartholin's cyst- resolved  Reviewed routine health maintenance.   F/u in 1 year or sooner prn for Annual GYN exam  Elmore Hyslop L. Harraway-Smith, M.D., 06/19/2019

## 2019-05-07 ENCOUNTER — Encounter: Payer: Self-pay | Admitting: Obstetrics & Gynecology

## 2019-07-19 DIAGNOSIS — N760 Acute vaginitis: Secondary | ICD-10-CM | POA: Diagnosis not present

## 2019-07-19 DIAGNOSIS — Z113 Encounter for screening for infections with a predominantly sexual mode of transmission: Secondary | ICD-10-CM | POA: Diagnosis not present

## 2019-12-27 ENCOUNTER — Other Ambulatory Visit: Payer: Self-pay | Admitting: Ophthalmology

## 2019-12-27 DIAGNOSIS — G932 Benign intracranial hypertension: Secondary | ICD-10-CM

## 2020-01-15 ENCOUNTER — Other Ambulatory Visit: Payer: Self-pay

## 2020-01-15 ENCOUNTER — Ambulatory Visit
Admission: RE | Admit: 2020-01-15 | Discharge: 2020-01-15 | Disposition: A | Discharge status: Home / Self Care | Payer: BC Managed Care – PPO | Source: Ambulatory Visit | Attending: Ophthalmology | Admitting: Ophthalmology

## 2020-01-15 ENCOUNTER — Other Ambulatory Visit: Payer: Self-pay | Admitting: Ophthalmology

## 2020-01-15 DIAGNOSIS — G932 Benign intracranial hypertension: Secondary | ICD-10-CM

## 2020-01-15 IMAGING — MR MR MRV HEAD W/O CM
2 series · 23 of 48 positions shown · IV contrast (agent unspecified)
Comparison: None.

CLINICAL DATA: Benign intracranial hypertension.

EXAM:
MRI HEAD WITHOUT AND WITH CONTRAST
MRV HEAD WITHOUT CONTRAST
TECHNIQUE: Multiplanar, multiecho pulse sequences of the brain and surrounding
structures were obtained without and with intravenous contrast.
Angiographic images of the intracranial venous structures were
obtained using MRV technique without intravenous contrast.

[Series 7: (id) coronal · coronal · 3.0mm · 0.49mm/px · 13 of 90 slices shown]
[im 1/90]
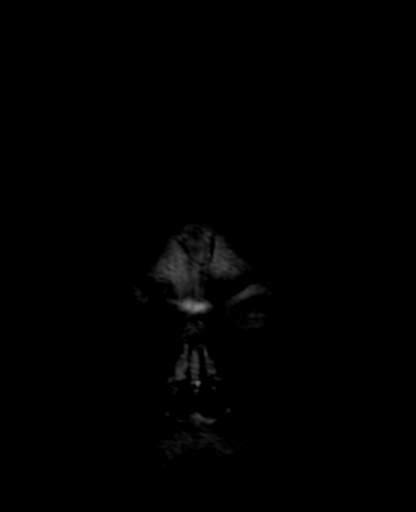
[im 4/90]
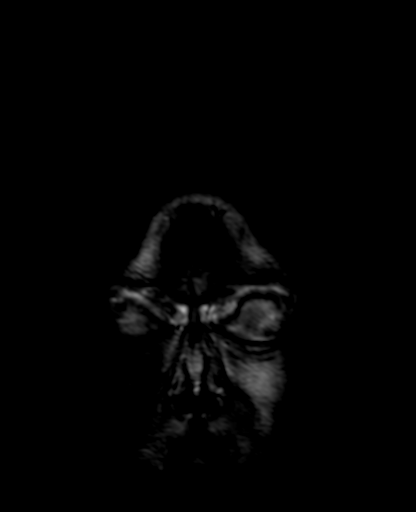
[im 8/90]
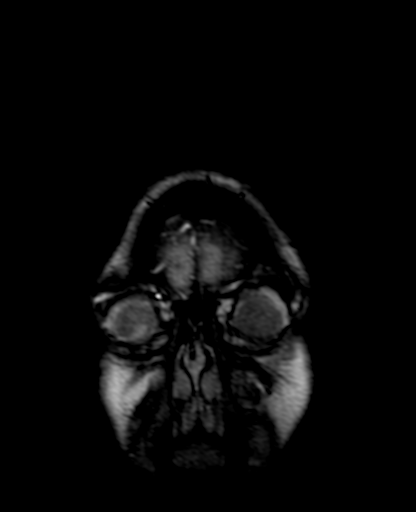
[im 12/90]
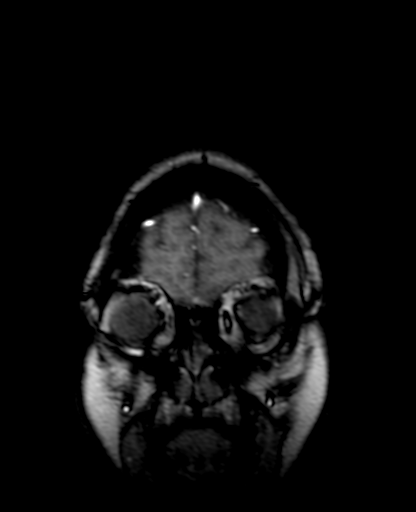
[im 16/90]
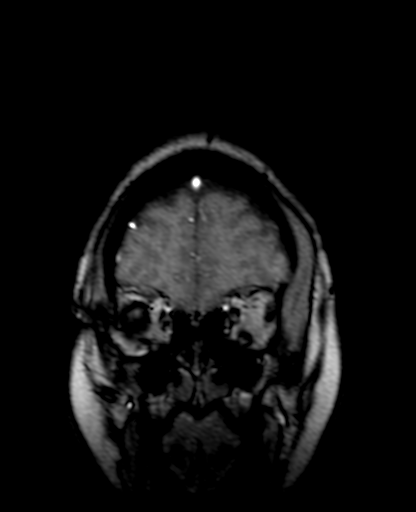
[im 28/90]
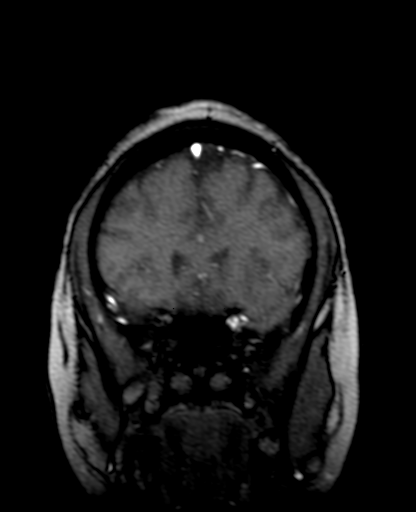
[im 39/90]
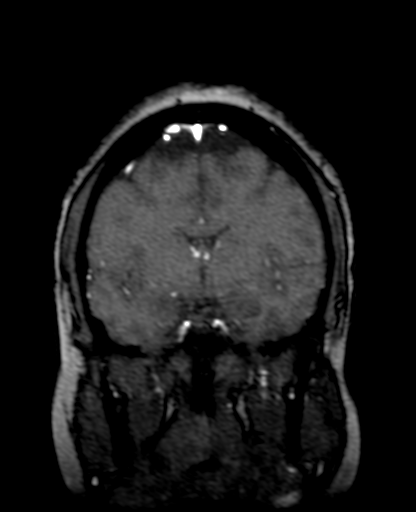
[im 47/90]
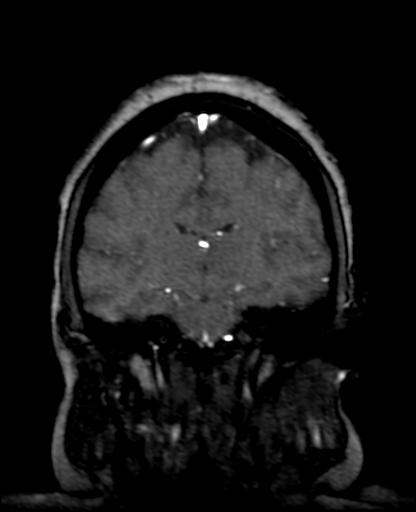
[im 51/90]
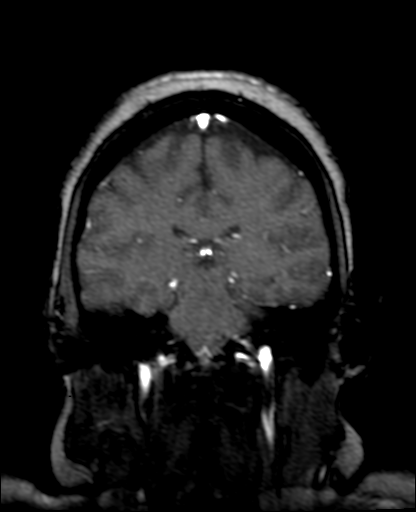
[im 62/90]
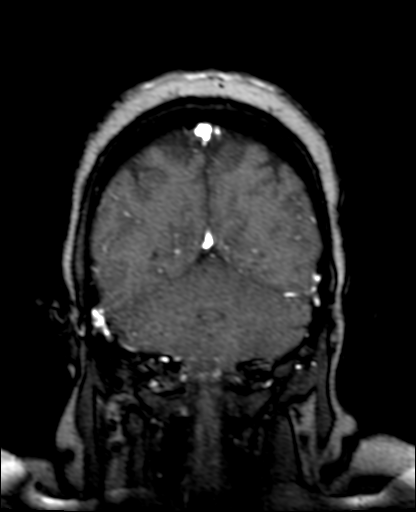
[im 74/90]
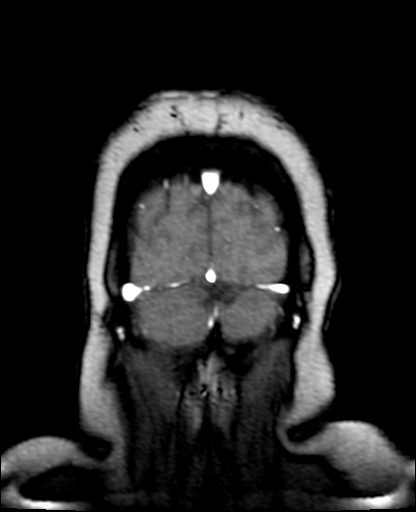
[im 78/90]
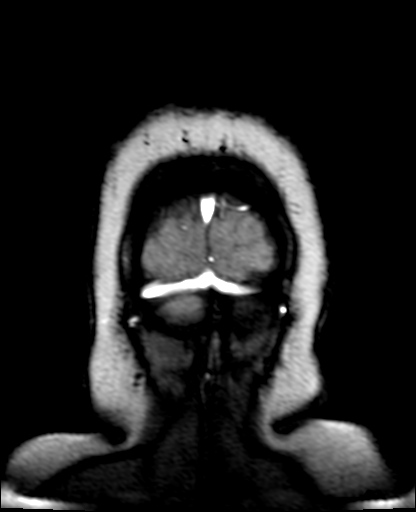
[im 86/90]
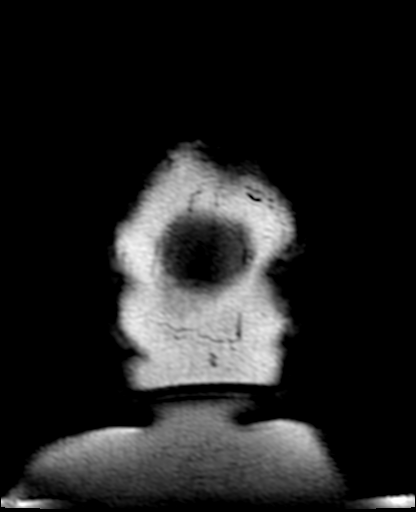

[Series 16: tof_3d cor · coronal · 1.5mm · 0.43mm/px · 10 of 88 slices shown]
[im 4/88]
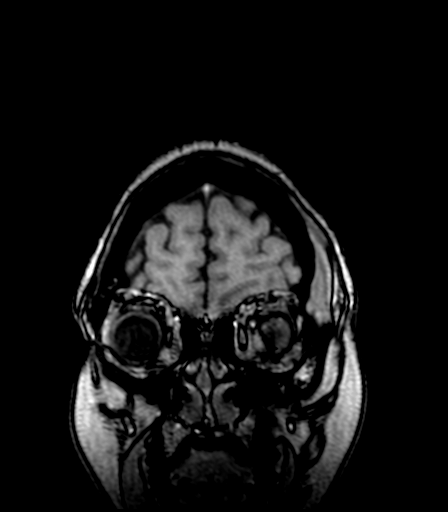
[im 16/88]
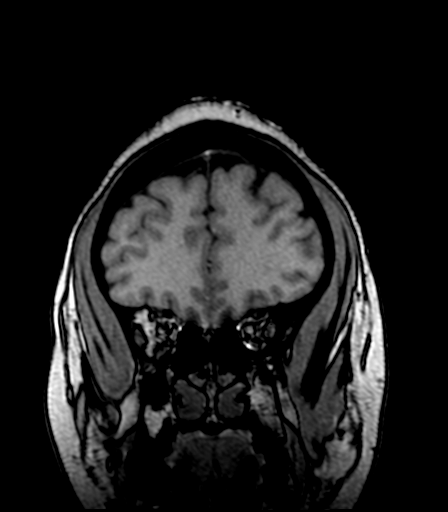
[im 27/88]
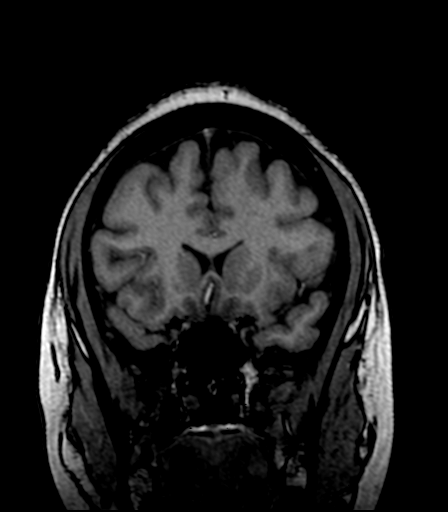
[im 38/88]
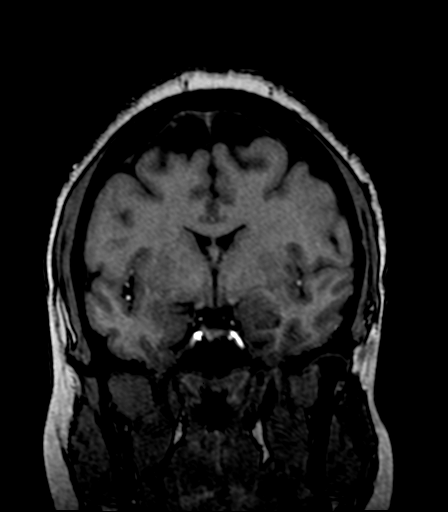
[im 46/88]
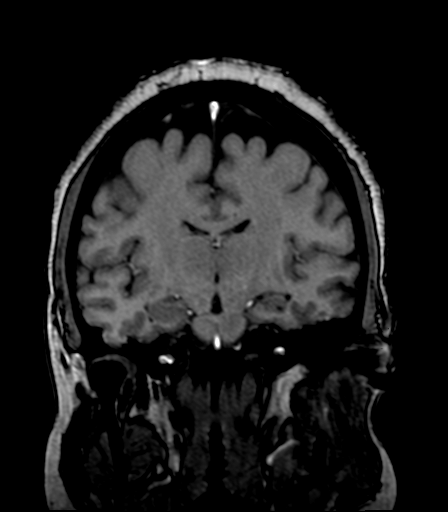
[im 50/88]
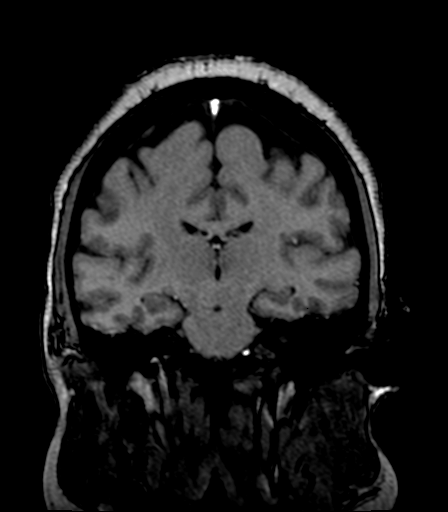
[im 61/88]
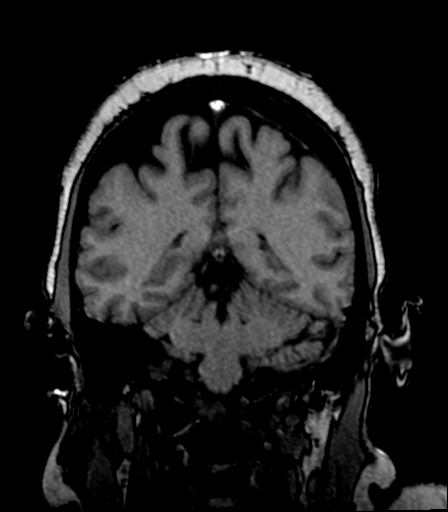
[im 72/88]
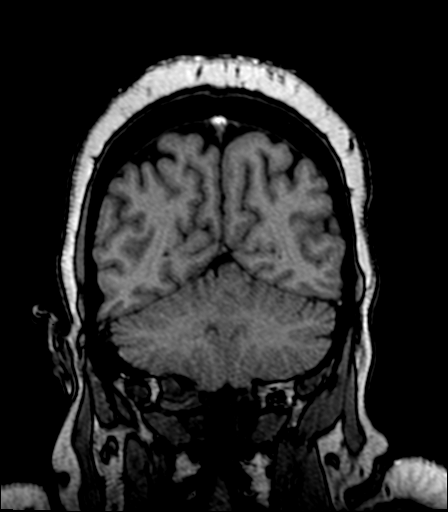
[im 76/88]
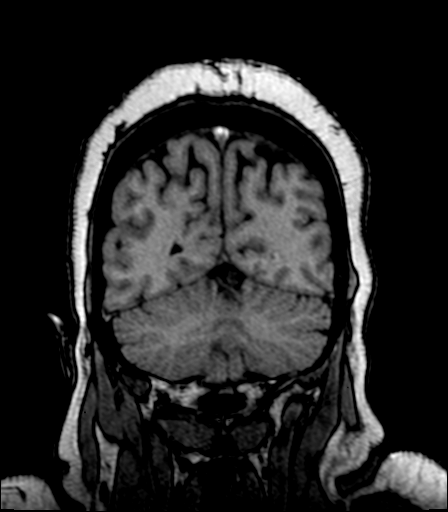
[im 84/88]
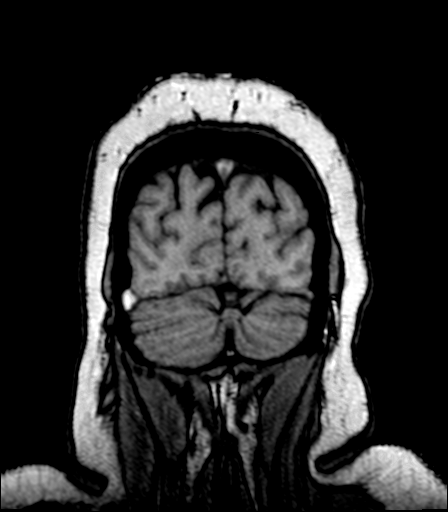

[23 of 48 positions shown; findings below may reference images not displayed]

FINDINGS: MRI HEAD WITHOUT CONTRAST

Brain: No diffusion-weighted signal abnormality. No intracranial
hemorrhage. No midline shift, ventriculomegaly or extra-axial fluid
collection. Partially decompressed appearance of the ventricular
system. No mass lesion. No abnormal enhancement. Cerebral volume is
within normal limits. No significant white matter disease. Partially
CSF filled sella turcica.

Vascular: Major intracranial arterial flow voids are proximally
preserved.

Skull and upper cervical spine: Normal marrow signal.

Sinuses/Orbits: Normal orbits. Trace right mastoid free fluid.
Pneumatized paranasal sinuses.

Other: None.

MR VENOGRAM WITHOUT CONTRAST

Narrowing of the left greater than right lateral transverse sinus
segments. There is also mild narrowing of the left sigmoid sinus.

Remaining major intracranial dural venous sinuses are patent.
IMPRESSION: Partially CSF filled sella turcica, decompressed appearance of the
ventricular system and bilateral transverse sinus narrowing.
Collectively these findings are suspicious for intracranial
hypertension.

Consider lumbar puncture with opening and closing pressures for
further evaluation.

These results will be called to the ordering clinician or
representative by the Radiologist Assistant, and communication
documented in the PACS or [REDACTED].

## 2020-01-15 IMAGING — MR MR HEAD WO/W CM
12 series · 48 of 48 positions shown · IV contrast (agent unspecified)
Comparison: None.

CLINICAL DATA: Benign intracranial hypertension.

EXAM:
MRI HEAD WITHOUT AND WITH CONTRAST
MRV HEAD WITHOUT CONTRAST
TECHNIQUE: Multiplanar, multiecho pulse sequences of the brain and surrounding
structures were obtained without and with intravenous contrast.
Angiographic images of the intracranial venous structures were
obtained using MRV technique without intravenous contrast.

[Series 2: T1 · sagittal · 5.0mm · 0.45mm/px · 1 of 21 slices shown]
[im 1/21]
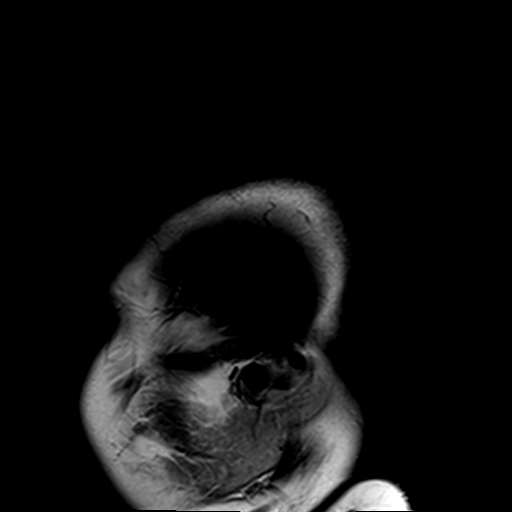

[Series 3: DWI · axial · 3.0mm · 1.80mm/px · z∈[-88,+57]mm · 7 of 99 slices shown (1 of 4)]
[im 1/99]
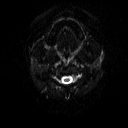
[im 17/99]
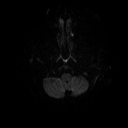
[im 33/99]
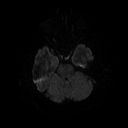
[im 50/99]
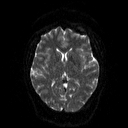
[im 66/99]
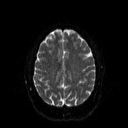
[im 82/99]
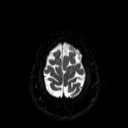
[im 99/99]
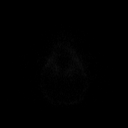

[Series 4: DWI · axial · 3.0mm · 1.80mm/px · z∈[-88,+57]mm · 3 of 49 slices shown (2 of 4)]
[im 1/49]
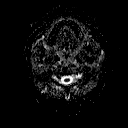
[im 25/49]
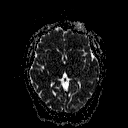
[im 49/49]
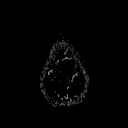

[Series 5: DWI · coronal · 5.0mm · 1.80mm/px · 5 of 68 slices shown (3 of 4)]
[im 1/68]
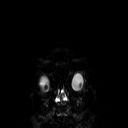
[im 17/68]
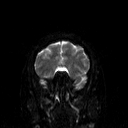
[im 34/68]
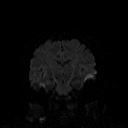
[im 51/68]
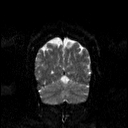
[im 68/68]
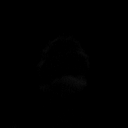

[Series 6: DWI · coronal · 5.0mm · 1.80mm/px · 2 of 34 slices shown (4 of 4)]
[im 1/34]
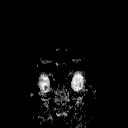
[im 34/34]
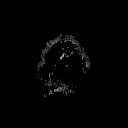

[Series 7: T2 · axial · 5.0mm · 0.60mm/px · z∈[-87,+53]mm · 2 of 22 slices shown (1 of 2)]
[im 1/22]
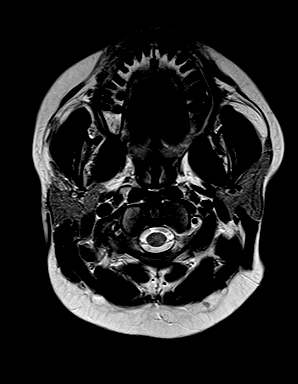
[im 22/22]
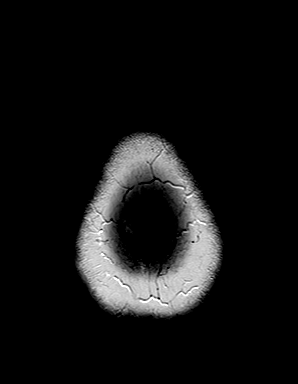

[Series 8: FLAIR · axial · 3.0mm · 0.45mm/px · z∈[-87,+55]mm · 2 of 32 slices shown]
[im 1/32]
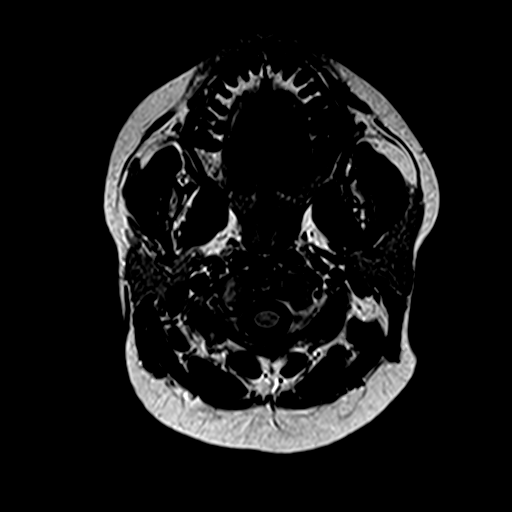
[im 32/32]
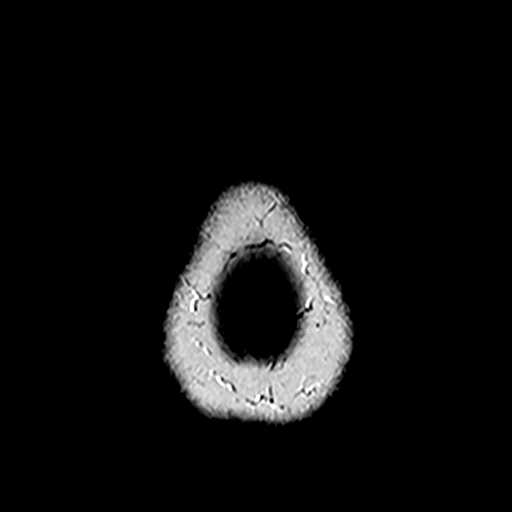

[Series 10: swi_images · axial · 4.0mm · 0.90mm/px · z∈[-85,+53]mm · 2 of 36 slices shown]
[im 1/36]
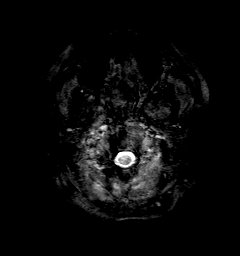
[im 36/36]
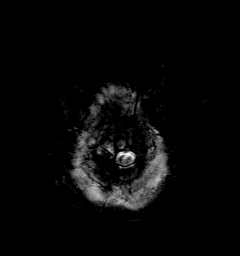

[Series 11: t1_mpr_tra · axial · 1.0mm · 0.75mm/px · z∈[-88,+53]mm · 10 of 144 slices shown (1 of 2)]
[im 1/144]
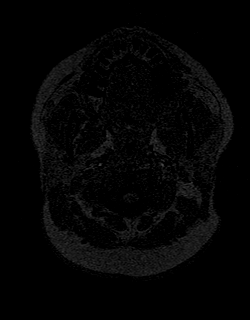
[im 16/144]
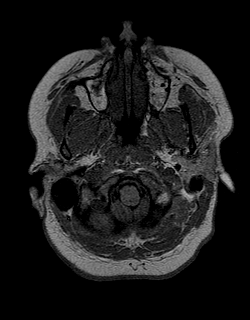
[im 32/144]
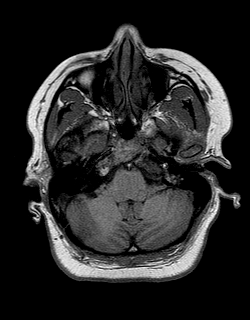
[im 48/144]
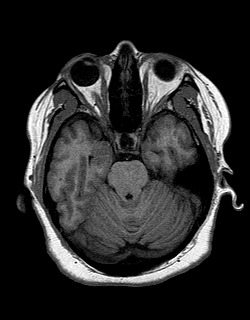
[im 64/144]
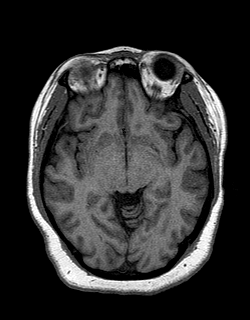
[im 80/144]
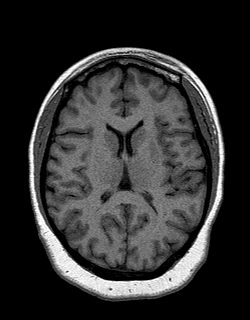
[im 96/144]
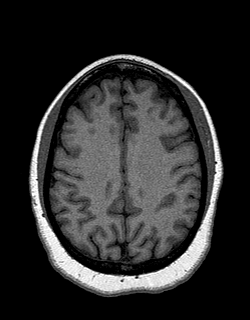
[im 112/144]
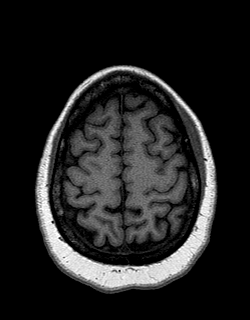
[im 128/144]
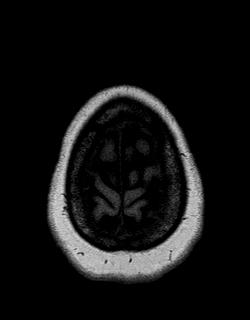
[im 144/144]
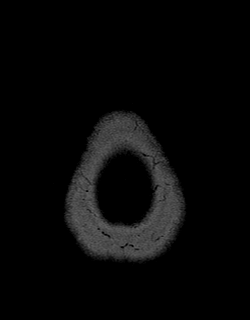

[Series 12: T2 · coronal · 5.0mm · 0.45mm/px · 2 of 25 slices shown (2 of 2)]
[im 1/25]
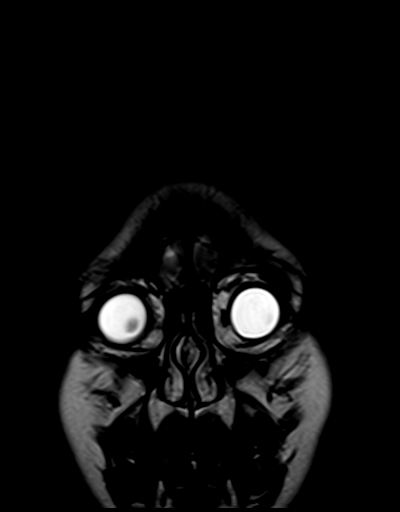
[im 25/25]
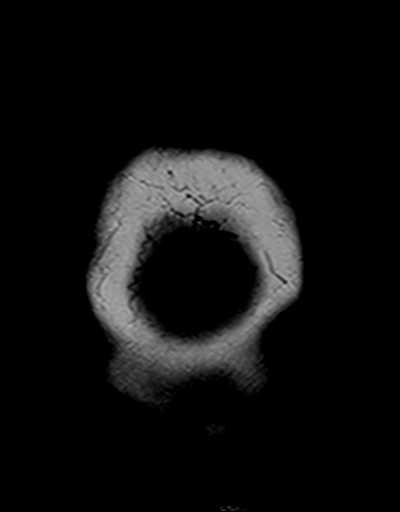

[Series 13: t1_mpr_tra · axial · 1.0mm · 0.75mm/px · z∈[-88,+53]mm · 10 of 144 slices shown (2 of 2)]
[im 1/144]
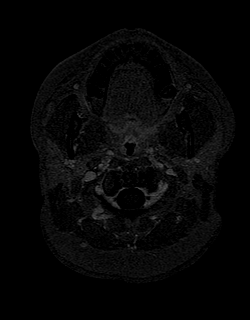
[im 16/144]
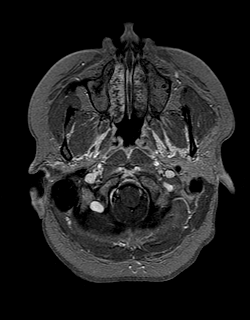
[im 32/144]
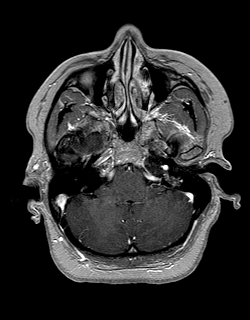
[im 48/144]
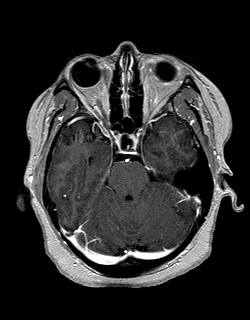
[im 64/144]
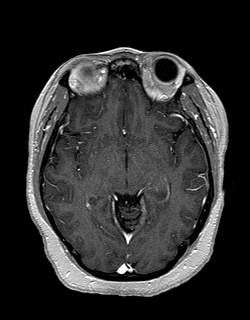
[im 80/144]
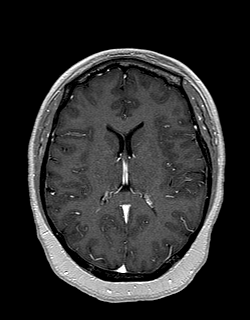
[im 96/144]
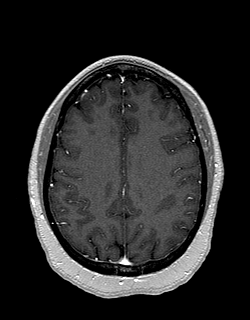
[im 112/144]
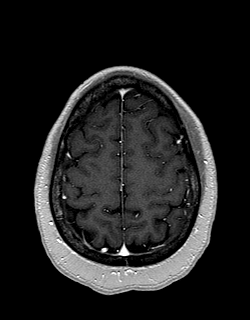
[im 128/144]
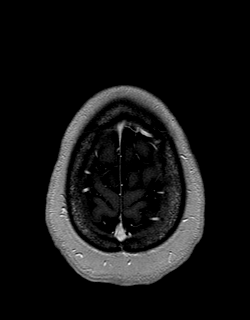
[im 144/144]
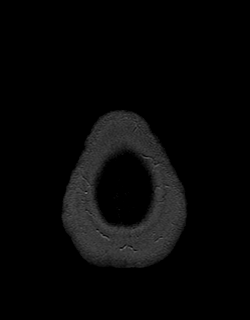

[Series 14: post cor · coronal · 5.0mm · 0.45mm/px · 2 of 25 slices shown]
[im 1/25]
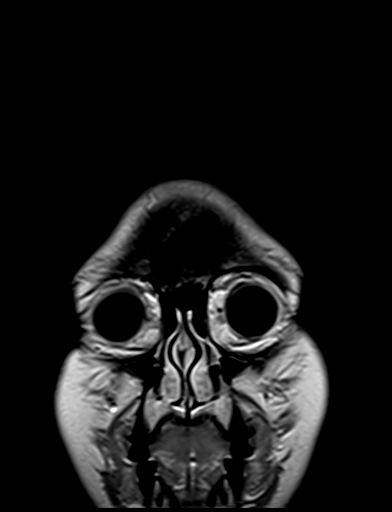
[im 25/25]
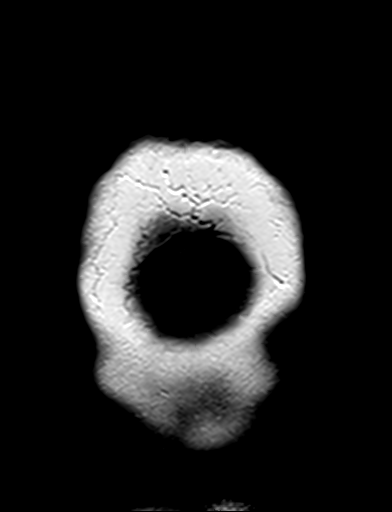

[48 of 48 positions shown; findings below may reference images not displayed]

FINDINGS: MRI HEAD WITHOUT CONTRAST

Brain: No diffusion-weighted signal abnormality. No intracranial
hemorrhage. No midline shift, ventriculomegaly or extra-axial fluid
collection. Partially decompressed appearance of the ventricular
system. No mass lesion. No abnormal enhancement. Cerebral volume is
within normal limits. No significant white matter disease. Partially
CSF filled sella turcica.

Vascular: Major intracranial arterial flow voids are proximally
preserved.

Skull and upper cervical spine: Normal marrow signal.

Sinuses/Orbits: Normal orbits. Trace right mastoid free fluid.
Pneumatized paranasal sinuses.

Other: None.

MR VENOGRAM WITHOUT CONTRAST

Narrowing of the left greater than right lateral transverse sinus
segments. There is also mild narrowing of the left sigmoid sinus.

Remaining major intracranial dural venous sinuses are patent.
IMPRESSION: Partially CSF filled sella turcica, decompressed appearance of the
ventricular system and bilateral transverse sinus narrowing.
Collectively these findings are suspicious for intracranial
hypertension.

Consider lumbar puncture with opening and closing pressures for
further evaluation.

These results will be called to the ordering clinician or
representative by the Radiologist Assistant, and communication
documented in the PACS or [REDACTED].

## 2020-01-15 MED ORDER — GADOBENATE DIMEGLUMINE 529 MG/ML IV SOLN
20.0000 mL | Freq: Once | INTRAVENOUS | Status: AC | PRN
  Administered 2020-01-15: 20 mL via INTRAVENOUS

## 2020-12-13 ENCOUNTER — Other Ambulatory Visit: Payer: Self-pay | Admitting: Family Medicine

## 2020-12-13 DIAGNOSIS — Z1231 Encounter for screening mammogram for malignant neoplasm of breast: Secondary | ICD-10-CM

## 2021-01-01 ENCOUNTER — Other Ambulatory Visit: Payer: Self-pay

## 2021-01-01 ENCOUNTER — Ambulatory Visit
Admission: RE | Admit: 2021-01-01 | Discharge: 2021-01-01 | Disposition: A | Discharge status: Home / Self Care | Payer: 59 | Source: Ambulatory Visit

## 2021-01-01 DIAGNOSIS — Z1231 Encounter for screening mammogram for malignant neoplasm of breast: Secondary | ICD-10-CM

## 2021-01-01 IMAGING — MG MM DIGITAL SCREENING BILAT W/ TOMO AND CAD
8 series · 8 of 24 positions shown · non-contrast
Comparison: None.

CLINICAL DATA: Screening.

EXAM:
DIGITAL SCREENING BILATERAL MAMMOGRAM WITH TOMOSYNTHESIS AND CAD
TECHNIQUE: Bilateral screening digital craniocaudal and mediolateral oblique
mammograms were obtained. Bilateral screening digital breast
tomosynthesis was performed. The images were evaluated with
computer-aided detection.

[L CC synth-2D]
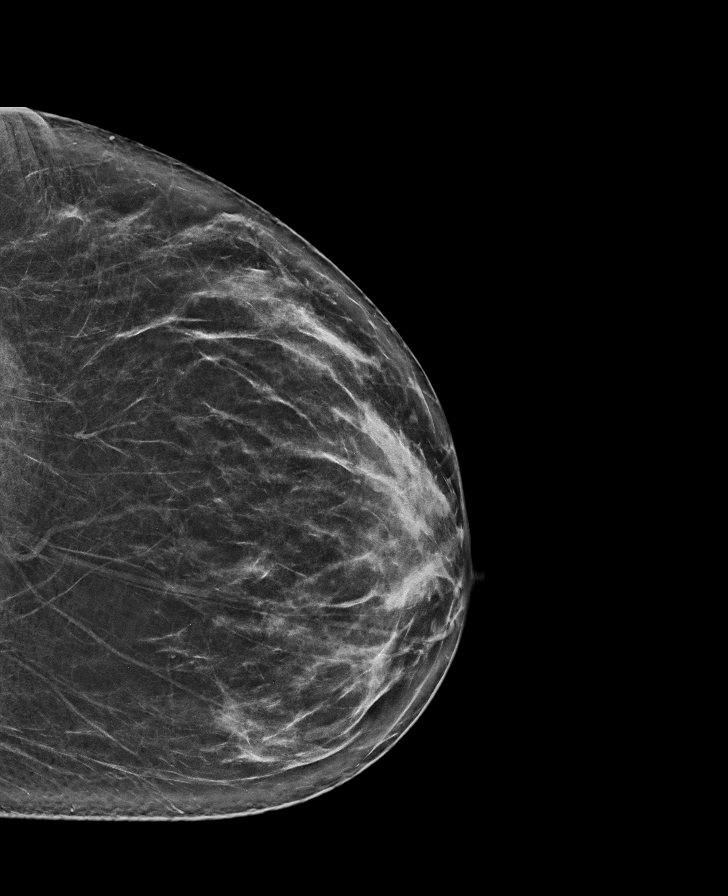

[R MLO synth-2D]
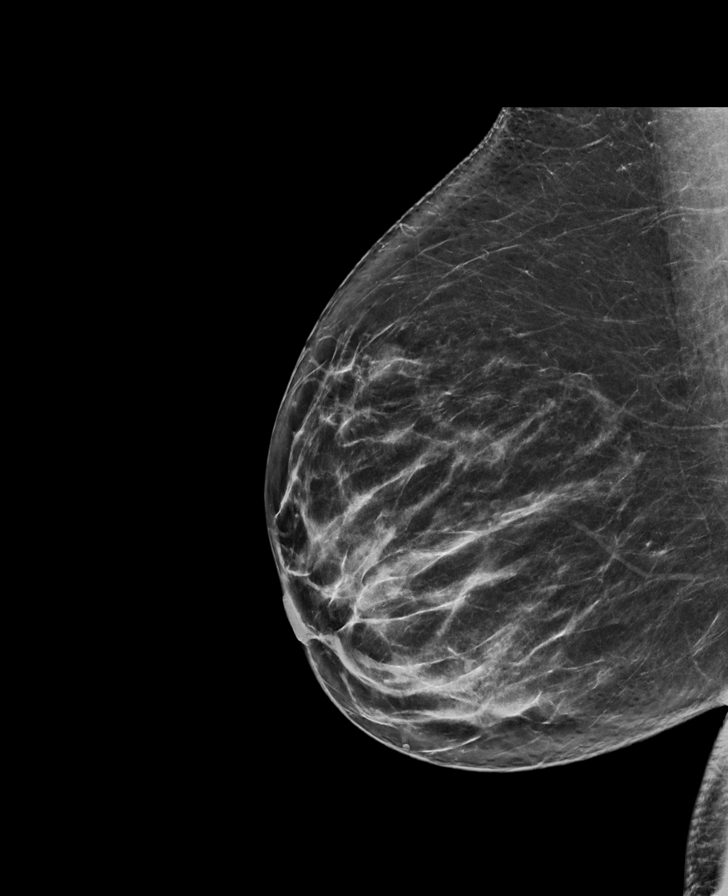

[L MLO synth-2D]
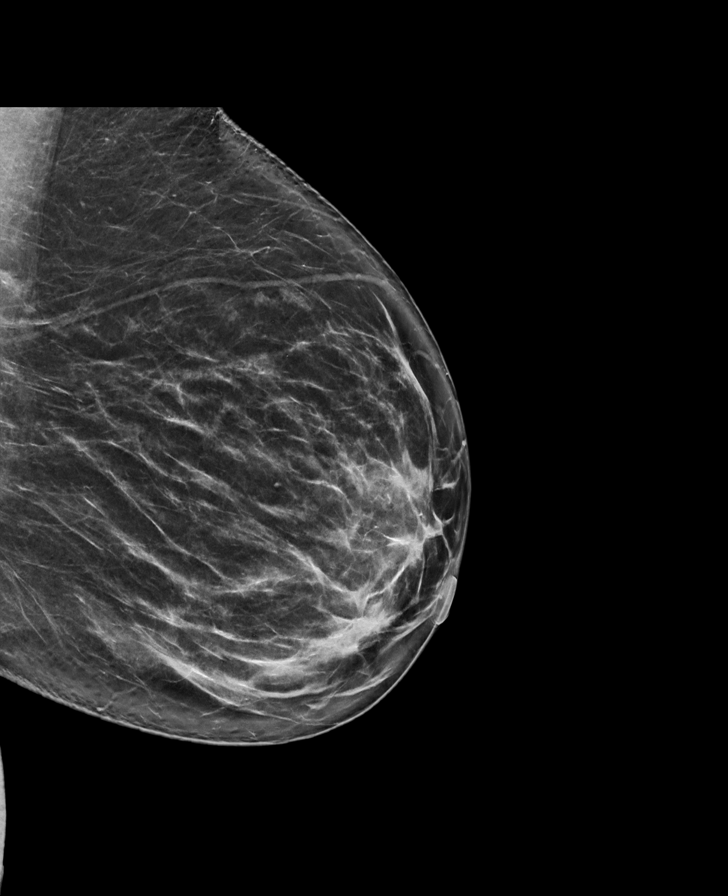

[R CC synth-2D]
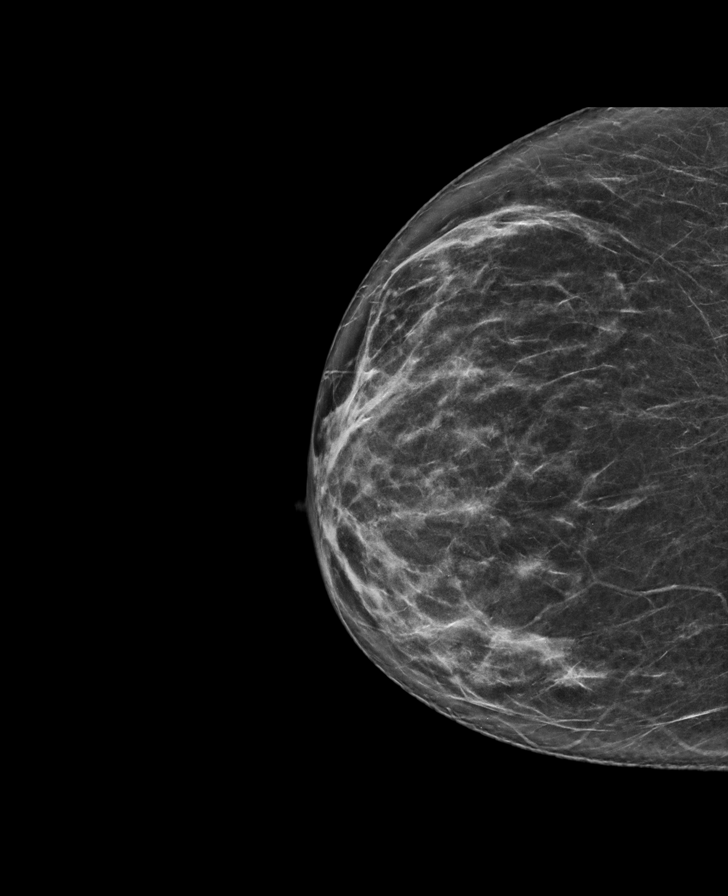

[L MLO tomo · tomo slice 42/83.0]
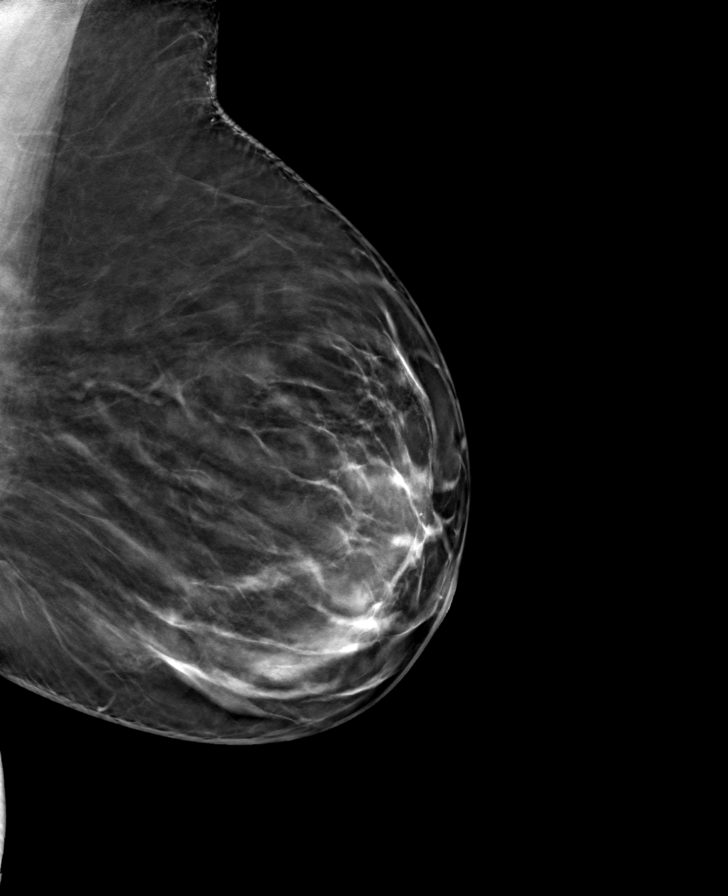

[R MLO tomo · tomo slice 41/80.0]
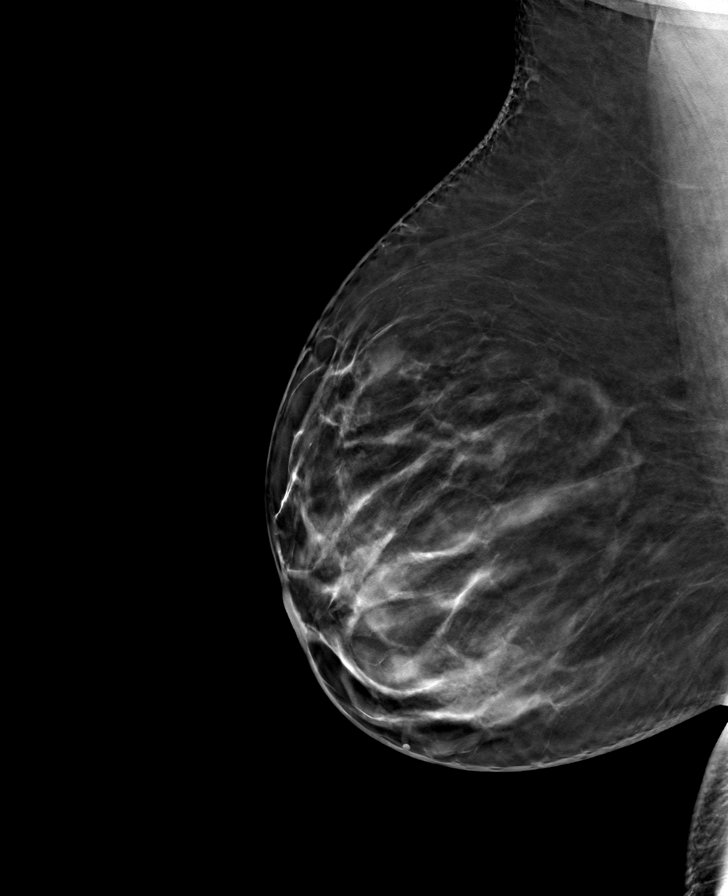

[R CC tomo · tomo slice 37/73.0]
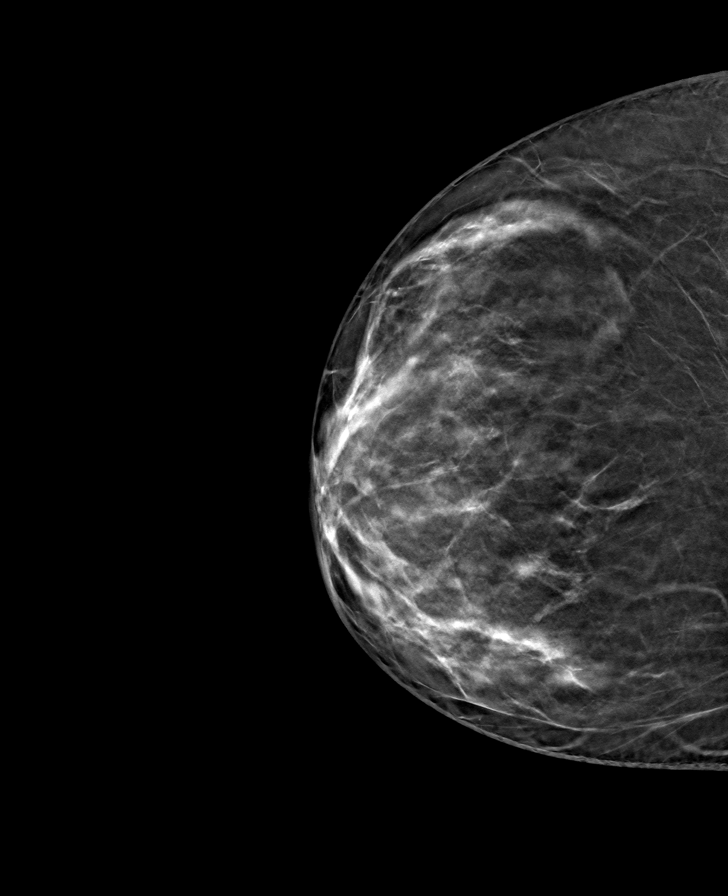

[L CC tomo · tomo slice 39/77.0]
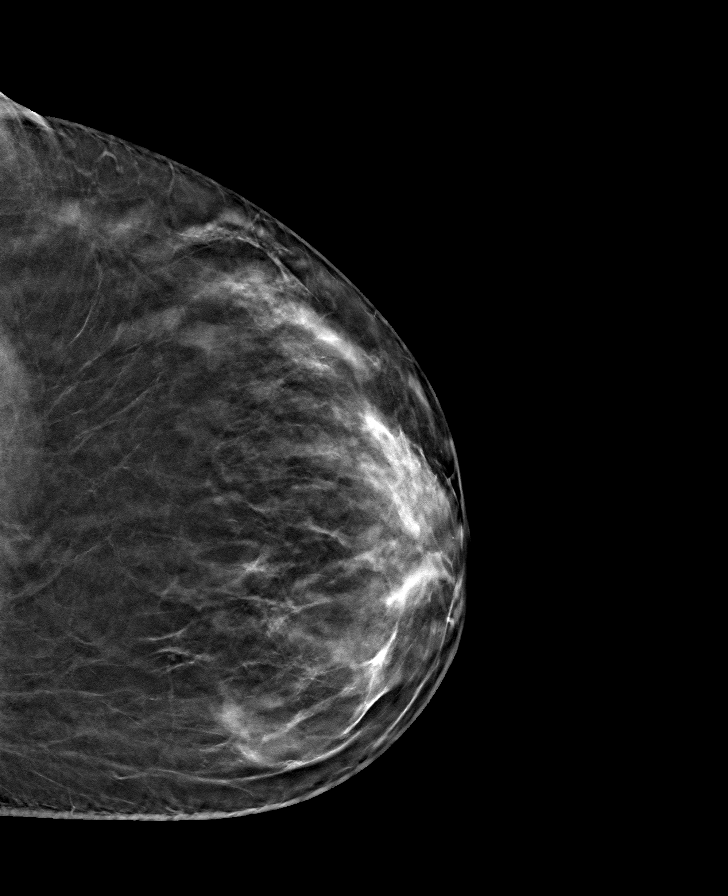

[8 of 24 positions shown; findings below may reference images not displayed]

ACR Breast Density Category b: There are scattered areas of
fibroglandular density.
FINDINGS: In the left breast, a possible asymmetry warrants further
evaluation. In the right breast, no findings suspicious for
malignancy.
IMPRESSION: Further evaluation is suggested for possible asymmetry in the left
breast.

RECOMMENDATION:
Diagnostic mammogram and possibly ultrasound of the left breast.
(Code:[89])

The patient will be contacted regarding the findings, and additional
imaging will be scheduled.

BI-RADS CATEGORY  0: Incomplete. Need additional imaging evaluation
and/or prior mammograms for comparison.

## 2021-01-05 ENCOUNTER — Other Ambulatory Visit: Payer: Self-pay | Admitting: Family Medicine

## 2021-01-05 DIAGNOSIS — R928 Other abnormal and inconclusive findings on diagnostic imaging of breast: Secondary | ICD-10-CM

## 2021-01-20 ENCOUNTER — Other Ambulatory Visit: Payer: Self-pay | Admitting: Family Medicine

## 2021-01-20 ENCOUNTER — Ambulatory Visit
Admission: RE | Admit: 2021-01-20 | Discharge: 2021-01-20 | Disposition: A | Discharge status: Home / Self Care | Payer: 59 | Source: Ambulatory Visit | Attending: Family Medicine | Admitting: Family Medicine

## 2021-01-20 ENCOUNTER — Other Ambulatory Visit: Payer: Self-pay

## 2021-01-20 DIAGNOSIS — R928 Other abnormal and inconclusive findings on diagnostic imaging of breast: Secondary | ICD-10-CM

## 2021-01-20 IMAGING — MG MM DIGITAL DIAGNOSTIC UNILAT*L* W/ TOMO W/ CAD
8 series · 8 of 24 positions shown · non-contrast
Comparison: Baseline screening mammogram dated [DATE].

CLINICAL DATA: Screening recall for possible left breast asymmetry.

EXAM:
DIGITAL DIAGNOSTIC UNILATERAL LEFT MAMMOGRAM WITH TOMOSYNTHESIS AND
CAD; ULTRASOUND LEFT BREAST LIMITED
TECHNIQUE: Left digital diagnostic mammography and breast tomosynthesis was
performed. The images were evaluated with computer-aided detection.;
Targeted ultrasound examination of the left breast was performed.

[L CC synth-2D (1 of 2)]
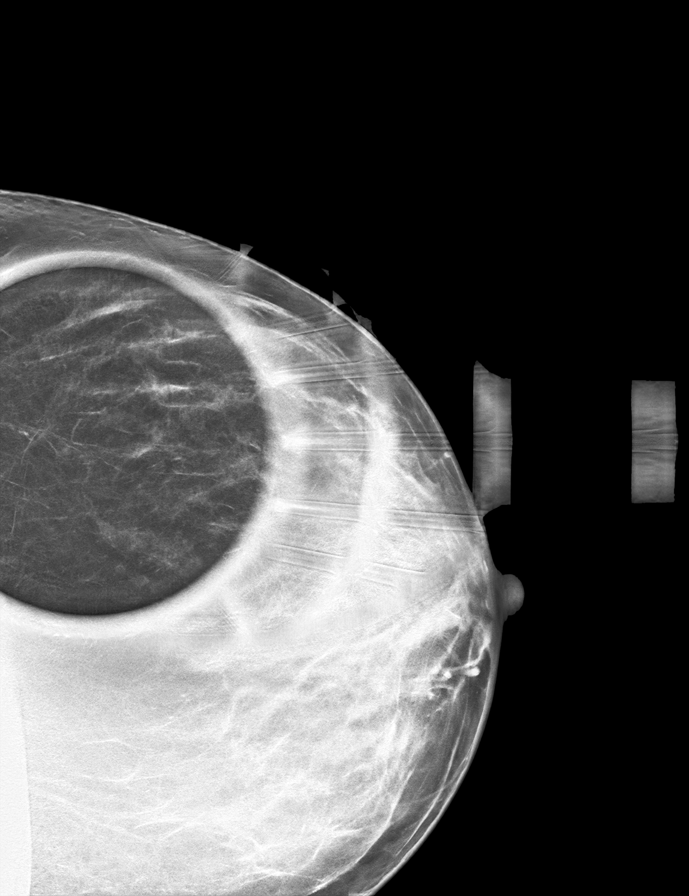

[L CC synth-2D (2 of 2)]
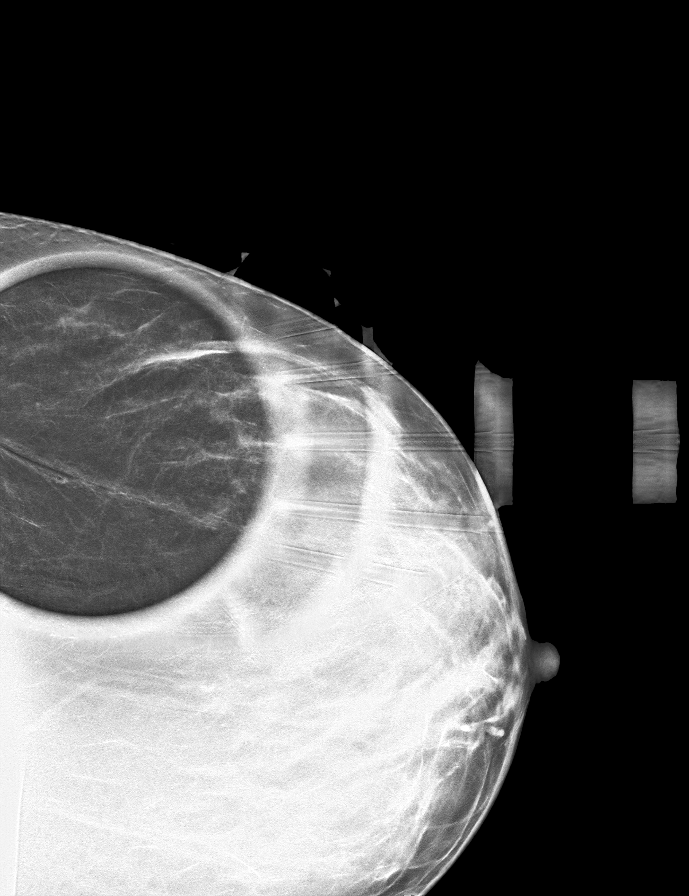

[L MLO synth-2D]
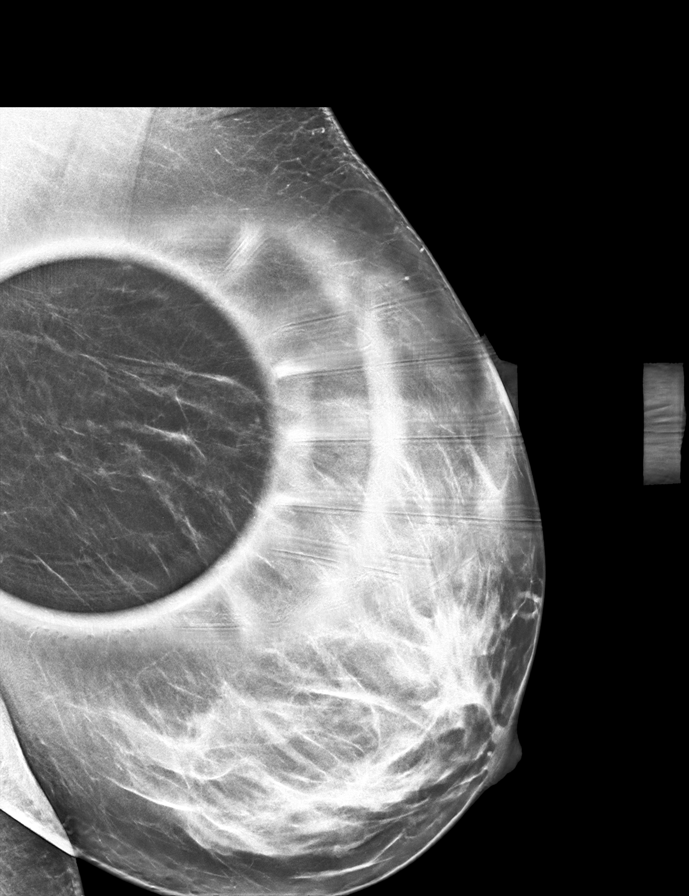

[L XCCL synth-2D]
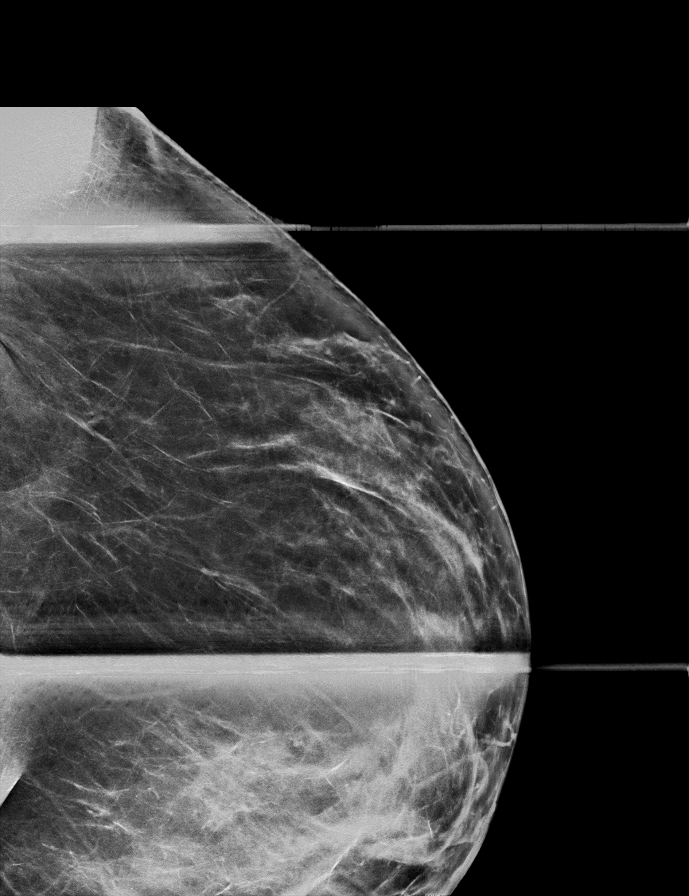

[L CC tomo (1 of 2) · tomo slice 29/56.0]
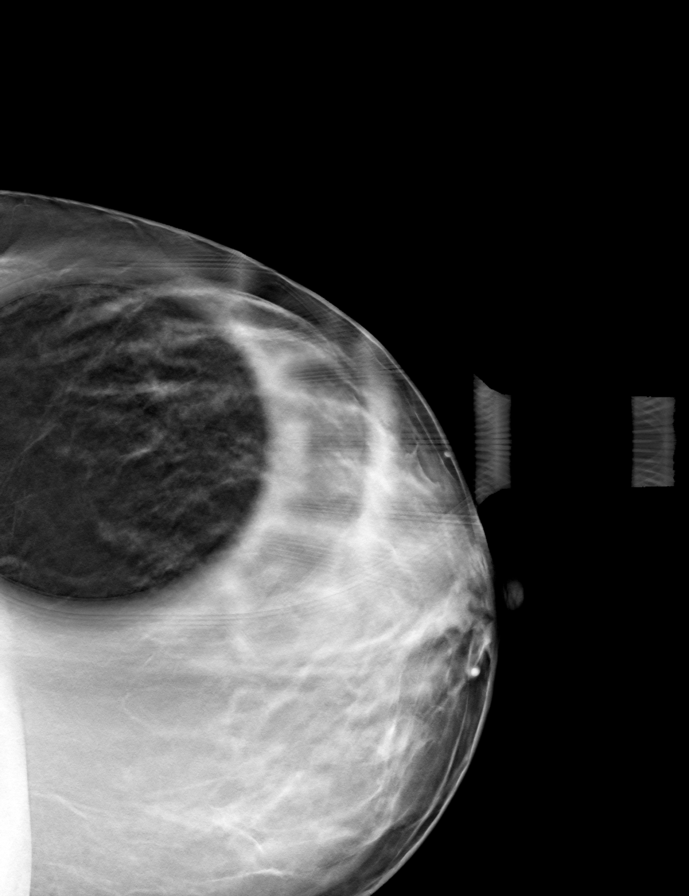

[L CC tomo (2 of 2) · tomo slice 29/57.0]
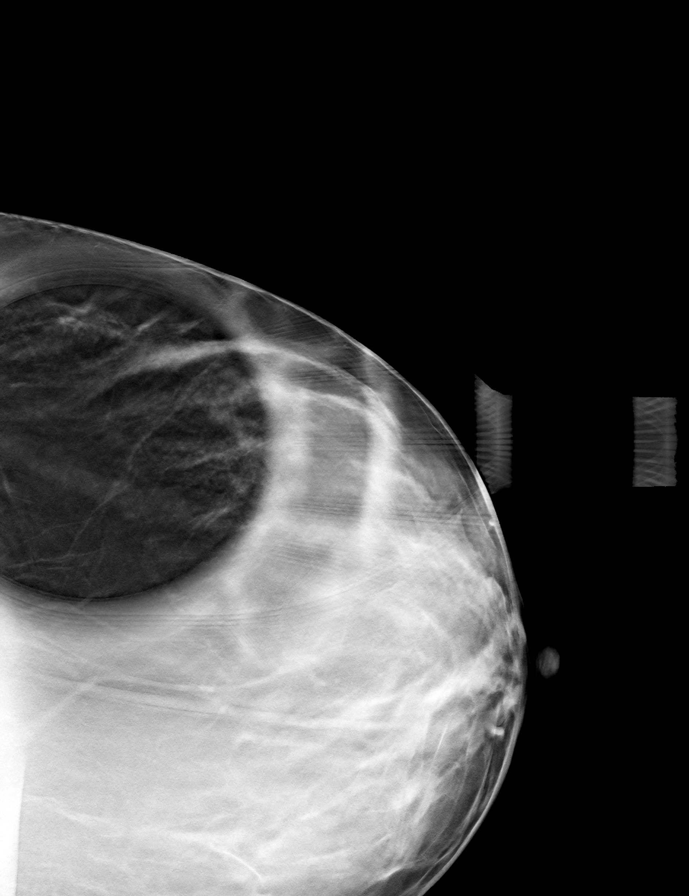

[L XCCL tomo · tomo slice 39/77.0]
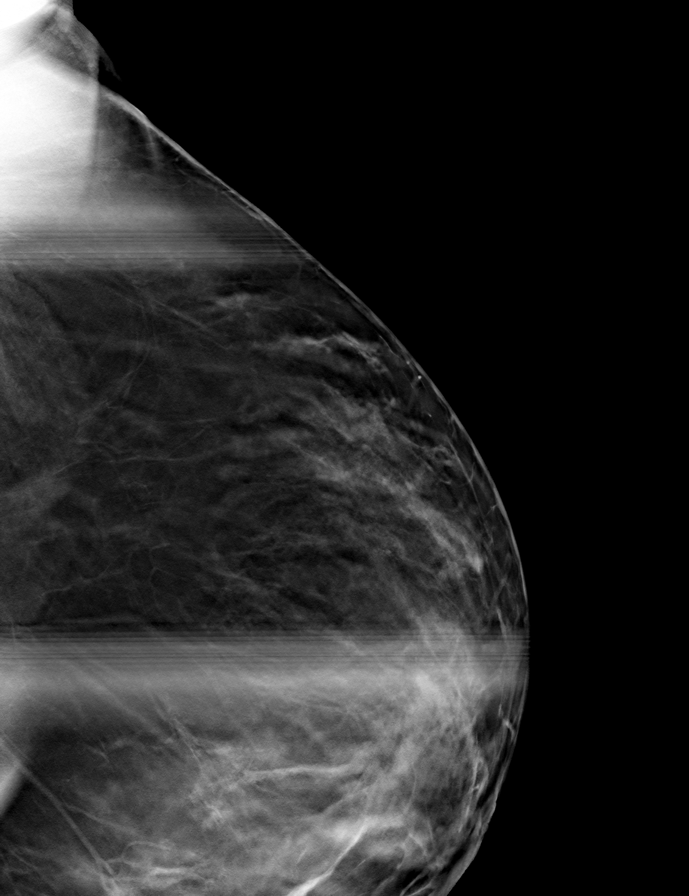

[L MLO tomo · tomo slice 33/65.0]
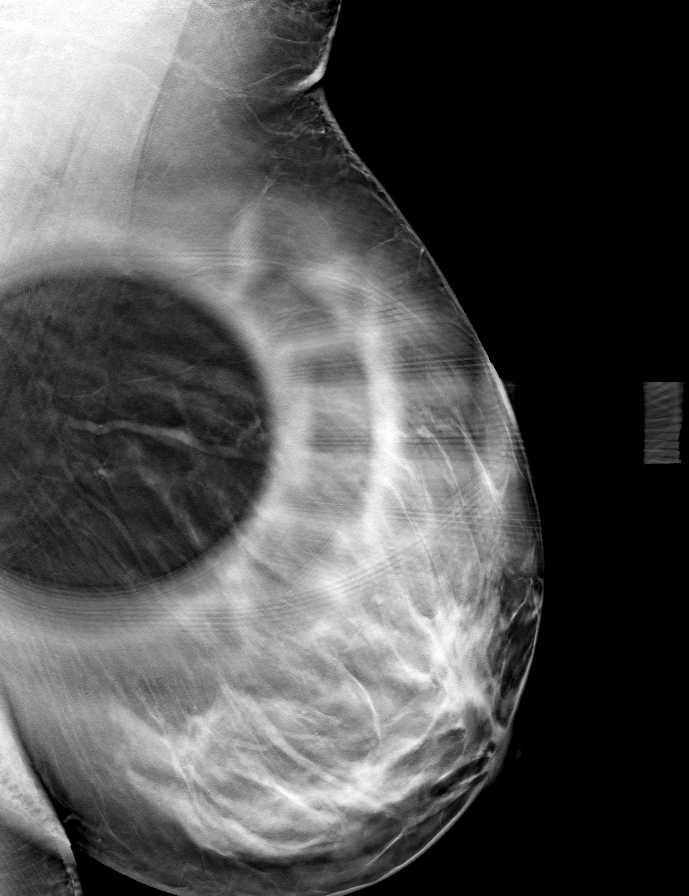

[8 of 24 positions shown; findings below may reference images not displayed]

ACR Breast Density Category b: There are scattered areas of
fibroglandular density.
FINDINGS: Additional tomograms were performed of the left breast. The
initially questioned possible left breast asymmetry is felt to
disperse on the additional imaging with findings suggestive of an
area of focal/dense fibroglandular tissue.

Targeted ultrasound of the entire outer left breast was performed.
No suspicious masses or abnormality seen, only
normal-appearing/heterogeneous fibroglandular tissue identified.
IMPRESSION: Probably benign left breast asymmetry.

RECOMMENDATION:
Recommend six-month follow-up diagnostic mammography of the left
breast with possible ultrasound.

I have discussed the findings and recommendations with the patient.
If applicable, a reminder letter will be sent to the patient
regarding the next appointment.

BI-RADS CATEGORY  3: Probably benign.

## 2021-01-24 ENCOUNTER — Other Ambulatory Visit: Payer: 59

## 2021-01-31 ENCOUNTER — Encounter: Payer: Self-pay | Admitting: Obstetrics & Gynecology

## 2021-01-31 ENCOUNTER — Other Ambulatory Visit (HOSPITAL_COMMUNITY)
Admission: RE | Admit: 2021-01-31 | Discharge: 2021-01-31 | Disposition: A | Discharge status: Home / Self Care | Payer: 59 | Source: Ambulatory Visit | Attending: Obstetrics & Gynecology | Admitting: Obstetrics & Gynecology

## 2021-01-31 ENCOUNTER — Other Ambulatory Visit: Payer: Self-pay

## 2021-01-31 ENCOUNTER — Ambulatory Visit (INDEPENDENT_AMBULATORY_CARE_PROVIDER_SITE_OTHER): Payer: 59 | Admitting: Obstetrics & Gynecology

## 2021-01-31 VITALS — BP 121/75 | HR 75 | Ht 62.0 in | Wt 189.0 lb

## 2021-01-31 DIAGNOSIS — Z3009 Encounter for other general counseling and advice on contraception: Secondary | ICD-10-CM | POA: Diagnosis not present

## 2021-01-31 DIAGNOSIS — Z01419 Encounter for gynecological examination (general) (routine) without abnormal findings: Secondary | ICD-10-CM | POA: Insufficient documentation

## 2021-01-31 MED ORDER — NORETHINDRONE ACET-ETHINYL EST 1-20 MG-MCG PO TABS: 1.0000 | ORAL_TABLET | Freq: Every day | ORAL | 11 refills | Status: DC

## 2021-01-31 NOTE — Patient Instructions (Addendum)
Intrauterine Device Information An intrauterine device (IUD) is a medical device that is inserted into the uterus to prevent pregnancy. It is a small, T-shaped device that has one or two nylon strings hanging down from it. The strings hang out of the lower part of the uterus (cervix) to allow for future IUD removal. There are two types of IUDs: Hormone IUD. This type of IUD is made of plastic and contains the hormone progestin (synthetic progesterone). A hormone IUD may last 3-5 years. Copper IUD. This type of IUD has copper wire wrapped around it. A copper IUD may last up to 10 years. How is an IUD inserted? An IUD is inserted through the vagina, through the cervix, and into the uterus with a minor medical procedure. The procedure for IUD insertion may vary among health care providers and hospitals. How does an IUD work? Synthetic progesterone in a hormonal IUD prevents pregnancy by: Thickening cervical mucus to prevent sperm from entering the uterus. Thinning the uterine lining to prevent a fertilized egg from being implanted there. Copper in a copper IUD prevents pregnancy by making the uterus and fallopian tubes produce a fluid that kills sperm. What are the advantages of an IUD? Advantages of either type of IUD An IUD: Is highly effective in preventing pregnancy. Is reversible. You can become pregnant shortly after the IUD is removed. Is low-maintenance and can stay in place for a long time. Has no estrogen-related side effects. Can be used when breastfeeding. Is not associated with weight gain. Can be inserted right after childbirth, an abortion, or a miscarriage. Advantages of a hormone IUD If it is inserted within 7 days of your period starting, it works right after it has been inserted. If the hormone IUD is inserted at any other time in your cycle, you will need to use a backup method of birth control for 7 days after insertion. It can make menstrual periods lighter or stop  completely. It can reduce menstrual cramping and other discomforts from menstrual periods. It can be used for 3-5 years, depending on which IUD you have. Advantages of a copper IUD It works right after it is inserted. It can be used as a form of emergency birth control if it is inserted within 5 days after having unprotected sex. It does not interfere with your body's natural hormones. It can be used for up to 10 years. What are the disadvantages of an IUD? An IUD may cause irregular menstrual bleeding for a period of time after insertion. It is common to have pain during insertion and have cramping and vaginal bleeding after insertion. An IUD may cut the uterus (uterine perforation) when it is inserted. This is rare. Pelvic inflammatory disease (PID) may happen after insertion of an IUD. PID is an infection in the uterus and fallopian tubes. The IUD does not cause the infection. The infection is usually from an unknown sexually transmitted infection (STI). This is rare, and it usually happens during the first 20 days after the IUD is inserted. A copper IUD can make your menstrual flow heavier and more painful. IUDs cannot prevent sexually transmitted infections (STIs). How is an IUD removed?  You will lie on your back with your knees bent and your feet in footrests (stirrups). A device will be inserted into your vagina to spread apart the vaginal walls (speculum). This will allow your health care provider to see the strings attached to the IUD. Your health care provider will use a small instrument (forceps) to   grasp the IUD strings and will pull firmly until the IUD is removed. You may have some discomfort when the IUD is removed. Your health care provider may recommend taking over-the-counter pain relievers, such as ibuprofen, before the procedure. You may also have minor spotting for a few days after the procedure. The procedure for IUD removal may vary among health care providers and  hospitals. Is an IUD right for me? If you are interested in an IUD, discuss it with your health care provider. He or she will make sure you are a good candidate for an IUD and will let you know more about the advantages, disadvantage, and possible side effects. This will allow you to make a decision about the device. Summary An intrauterine device (IUD) is a medical device that is inserted in the uterus to prevent pregnancy. It is a small, T-shaped device that has one or two nylon strings hanging down from it. A hormone IUD contains the hormone progestin (synthetic progesterone). A copper IUD has copper wire wrapped around it. Synthetic progesterone in a hormone IUD prevents pregnancy by thickening cervical mucus and thinning the walls of the uterus. Copper in a copper IUD prevents pregnancy by making the uterus and fallopian tubes produce a fluid that kills sperm. A hormone IUD can be left in place for 3-5 years. A copper IUD can be left in place for up to 10 years. An IUD is inserted and removed by a health care provider. You may feel some pain during insertion and removal. Your health care provider may recommend taking over-the-counter pain medicine, such as ibuprofen, before an IUD procedure. This information is not intended to replace advice given to you by your health care provider. Make sure you discuss any questions you have with your health care provider. Document Revised: 09/15/2019 Document Reviewed: 09/15/2019 Elsevier Patient Education  2022 [employer] Oral Contraception Information Oral contraceptive pills (OCPs) are medicines taken by mouth to prevent pregnancy. They work by: Preventing the ovaries from releasing eggs. Thickening mucus in the lower part of the uterus (cervix). This prevents sperm from entering the uterus. Thinning the lining of the uterus (endometrium). This prevents a fertilized egg from attaching to the endometrium. OCPs are highly effective when taken exactly  as prescribed. However, OCPs do not prevent STIs (sexually transmitted infections). Using condoms while on an OCP can help prevent STIs. What happens before starting OCPs? Before you start taking OCPs: You may have a physical exam, blood test, and Pap test. Your health care provider will make sure you are a good candidate for oral contraception. OCPs are not a good option for certain women, such as: Women who smoke and are older than age 57. Women who have or have had certain conditions, such as: A history of high blood pressure. Deep vein thrombosis. Pulmonary embolism. Stroke. Cardiovascular disease. Peripheral vascular disease. Ask your health care provider about the possible side effects of the OCP you may be prescribed. Be aware that it can take 2-3 months for your body to adjust to changes in hormone levels. Types of oral contraception Birth control pills contain the hormones estrogen and progestin (synthetic progesterone) or progestin only. The combination pill This type of pill contains estrogen and progestin hormones. Conventional contraception pills come in packs of 21 or 28 pills. Some packs with 28-day pills contain estrogen and progestin for the first 21-24 days. Hormone-free tablets, called placebos, are taken for the final 4-7 days. You should have menstrual bleeding during the time you  take the placebos. In packs with 21 tablets, you take no pills for 7 days. Menstrual bleeding occurs during these days. (Some people prefer taking a pill for 28 days to help establish a routine). Extended-interval contraception pills come in packs of 91 pills. The first 84 tablets have both estrogen and progestin. The last 7 pills are placebos. Menstrual bleeding occurs during the placebo days. With this schedule, menstrual bleeding happens once every 3 months. Continuous contraception pills come in packs of 28 pills. All pills in the pack contain estrogen and progestin. With this schedule,  regular menstrual bleeding does not happen, but there may be spotting or irregular bleeding. Progestin-only pills This type of pill is often called the mini-pill and contains the progestin hormone only. It comes in packs of 28 pills. In some packs, the last 4 pills are placebos. The pill must be taken at the same time every day. This is very important to prevent pregnancy. Menstrual bleeding may not be regular or predictable. What are the advantages? Oral contraception provides reliable and continuous contraception if taken as directed. It may treat or decrease symptoms of: Menstrual period cramps. Irregular menstrual cycle or bleeding. Heavy menstrual flow. Abnormal uterine bleeding. Acne, depending on the type of pill. Polycystic ovarian syndrome (POS). Endometriosis. Iron deficiency anemia. Premenstrual symptoms, including severe irritability, depression, or anxiety. It also may: Reduce the risk of endometrial and ovarian cancer. Be used as emergency contraception. Prevent ectopic pregnancies and infections of the fallopian tubes. What can make OCPs less effective? OCPs may be less effective if: You forget to take the pill every day. For progestin-only pills, it is especially important to take the pill at the same time each day. Even taking it 3 hours late can increase the risk of pregnancy. You have a stomach or intestinal disease that reduces your body's ability to absorb the pill. You take OCPs with other medicines that make OCPs less effective, such as antibiotics, certain HIV medicines, and some seizure medicines. You take expired OCPs. You forget to restart the pill after 7 days of not taking it. This refers to the packs of 21 pills. What are the side effects and risks? OCPs can sometimes cause side effects, such as: Headache. Depression. Trouble sleeping. Nausea and vomiting. Breast tenderness. Irregular bleeding or spotting during the first several months. Bloating or  fluid retention. Increase in blood pressure. Combination pills may slightly increase the risk of: Blood clots. Heart attack. Stroke. Follow these instructions at home: Follow instructions from your health care provider about how to start taking your first cycle of OCPs. Depending on when you start the pill, you may need to use a backup form of birth control, such as condoms, during the first week. Make sure you know what steps to take if you forget to take the pill. Summary Oral contraceptive pills (OCPs) are medicines taken by mouth to prevent pregnancy. They are highly effective when taken exactly as prescribed. OCPs contain a combination of the hormones estrogen and progestin (synthetic progesterone) or progestin only. Before you start taking the pill, you may have a physical exam, blood test, and Pap test. Your health care provider will make sure you are a good candidate for oral contraception. The combination pill may come in a 21-day pack, a 28-day pack, or a 91-day pack. Progestin-only pills come in packs of 28 pills. OCPs can sometimes cause side effects, such as headache, nausea, breast tenderness, or irregular bleeding. This information is not intended to replace advice given  to you by your health care provider. Make sure you discuss any questions you have with your health care provider. Document Revised: 12/03/2019 Document Reviewed: 11/11/2019 Elsevier Patient Education  2022 [employer].

## 2021-01-31 NOTE — Progress Notes (Signed)
Subjective:     Regina Mason is a 40 y.o. female here for a routine exam. G2P2 Current complaints: none. Pt is married and sexually active. They are using withdrawal and condoms (occ) for contraception.   She was on OCPs many years    Gynecologic History Patient's last menstrual period was 01/23/2021. Contraception: condoms and rhythm method Last Pap: 09/05/2017. Results were: normal Last mammogram: 01/20/2021. Results were: benign birad 3  Obstetric History OB History  Gravida Para Term Preterm AB Living  2 2 2     2   SAB IAB Ectopic Multiple Live Births          2    # Outcome Date GA Lbr Len/2nd Weight Sex Delivery Anes PTL Lv  2 Term 04/03/11 [redacted]w[redacted]d 10:34 / 00:43 5 lb 10.7 oz (2.571 kg) F VBAC EPI  LIV     Birth Comments: HIV neg Passed hearing Normal NB screen  1 Term 2011 [redacted]w[redacted]d   M CS-LTranv EPI N LIV     Birth Comments: System Generated. Please review and update pregnancy details.     The following portions of the patient's history were reviewed and updated as appropriate: allergies, current medications, past family history, past medical history, past social history, past surgical history, and problem list.  Review of Systems Pertinent items are noted in HPI.    Objective:  BP 121/75   Pulse 75   Ht 5\' 2"  (1.575 m)   Wt 189 lb (85.7 kg)   LMP 01/23/2021   BMI 34.57 kg/m   General Appearance:    Alert, cooperative, no distress, appears stated age  Head:    Normocephalic, without obvious abnormality, atraumatic  Eyes:    conjunctiva/corneas clear, EOM's intact, both eyes  Ears:    Normal external ear canals, both ears  Nose:   Nares normal, septum midline, mucosa normal, no drainage    or sinus tenderness  Throat:   Lips, mucosa, and tongue normal; teeth and gums normal  Neck:   Supple, symmetrical, trachea midline, no adenopathy;    thyroid:  no enlargement/tenderness/nodules  Back:     Symmetric, no curvature, ROM normal, no CVA tenderness  Lungs:      respirations unlabored  Chest Wall:    No tenderness or deformity   Heart:    Regular rate and rhythm  Breast Exam:    No tenderness, masses, or nipple abnormality  Abdomen:     Soft, non-tender, bowel sounds active all four quadrants,    no masses, no organomegaly  Genitalia:    Normal female without lesion, discharge or tenderness     Extremities:   Extremities normal, atraumatic, no cyanosis or edema  Pulses:   2+ and symmetric all extremities  Skin:   Skin color, texture, turgor normal, no rashes or lesions     Assessment:    Healthy female exam.  Reviewed all forms of birth control options available including abstinence; over the counter/barrier methods; hormonal contraceptive medication including pill, patch, ring, injection,contraceptive implant; hormonal and nonhormonal IUDs; permanent sterilization options including vasectomy and the various tubal sterilization modalities. Risks and benefits reviewed.  Questions were answered.  Information was given to patient to review.     Plan:   Regina Mason was seen today for annual exam.  Diagnoses and all orders for this visit:  Well female exam with routine gynecological exam -     Cytology - PAP( [facility])  Encounter for counseling regarding contraception -     norethindrone-ethinyl  estradiol (LOESTRIN 1/20, 21,) 1-20 MG-MCG tablet; Take 1 tablet by mouth daily.   F/u 3 months for pill check or sooner prn  F/u in 1 year for annual.   Regina Mason L. Erin Fulling, M.D., [facility] /

## 2021-02-09 LAB — CYTOLOGY - PAP
Comment: NEGATIVE
Diagnosis: UNDETERMINED — AB
High risk HPV: POSITIVE — AB

## 2021-03-15 ENCOUNTER — Encounter: Payer: Self-pay | Admitting: Emergency Medicine

## 2021-03-15 ENCOUNTER — Emergency Department (INDEPENDENT_AMBULATORY_CARE_PROVIDER_SITE_OTHER)
Admission: EM | Admit: 2021-03-15 | Discharge: 2021-03-15 | Disposition: A | Discharge status: Home / Self Care | Payer: 59 | Source: Home / Self Care

## 2021-03-15 ENCOUNTER — Emergency Department (INDEPENDENT_AMBULATORY_CARE_PROVIDER_SITE_OTHER): Payer: 59

## 2021-03-15 DIAGNOSIS — M79605 Pain in left leg: Secondary | ICD-10-CM

## 2021-03-15 DIAGNOSIS — I824Z2 Acute embolism and thrombosis of unspecified deep veins of left distal lower extremity: Secondary | ICD-10-CM | POA: Diagnosis not present

## 2021-03-15 DIAGNOSIS — M79672 Pain in left foot: Secondary | ICD-10-CM | POA: Diagnosis not present

## 2021-03-15 LAB — CBC WITH DIFFERENTIAL/PLATELET
Absolute Monocytes: 693 cells/uL (ref 200–950)
Basophils Absolute: 33 cells/uL (ref 0–200)
Basophils Relative: 0.3 %
Eosinophils Absolute: 33 cells/uL (ref 15–500)
Eosinophils Relative: 0.3 %
HCT: 37.4 % (ref 35.0–45.0)
Hemoglobin: 11.6 g/dL — ABNORMAL LOW (ref 11.7–15.5)
Lymphs Abs: 2101 cells/uL (ref 850–3900)
MCH: 24.2 pg — ABNORMAL LOW (ref 27.0–33.0)
MCHC: 31 g/dL — ABNORMAL LOW (ref 32.0–36.0)
MCV: 77.9 fL — ABNORMAL LOW (ref 80.0–100.0)
MPV: 11.6 fL (ref 7.5–12.5)
Monocytes Relative: 6.3 %
Neutro Abs: 8140 cells/uL — ABNORMAL HIGH (ref 1500–7800)
Neutrophils Relative %: 74 %
Platelets: 441 10*3/uL — ABNORMAL HIGH (ref 140–400)
RBC: 4.8 10*6/uL (ref 3.80–5.10)
RDW: 17.1 % — ABNORMAL HIGH (ref 11.0–15.0)
Total Lymphocyte: 19.1 %
WBC: 11 10*3/uL — ABNORMAL HIGH (ref 3.8–10.8)

## 2021-03-15 LAB — BASIC METABOLIC PANEL
BUN/Creatinine Ratio: 6 (calc) (ref 6–22)
BUN: 6 mg/dL — ABNORMAL LOW (ref 7–25)
CO2: 18 mmol/L — ABNORMAL LOW (ref 20–32)
Calcium: 9.5 mg/dL (ref 8.6–10.2)
Chloride: 110 mmol/L (ref 98–110)
Creat: 1.06 mg/dL — ABNORMAL HIGH (ref 0.50–0.99)
Glucose, Bld: 88 mg/dL (ref 65–99)
Potassium: 3.9 mmol/L (ref 3.5–5.3)
Sodium: 138 mmol/L (ref 135–146)

## 2021-03-15 IMAGING — US US EXTREM LOW VENOUS*L*
1 series · 13 of 24 positions shown · non-contrast
Comparison: None.

CLINICAL DATA: Left foot pain for 1 week but worsening in the past
2 days



[Series 1: us venous img lower uni left (dvt) · portal-venous · 13 of 45 slices shown]
[im 1/45]
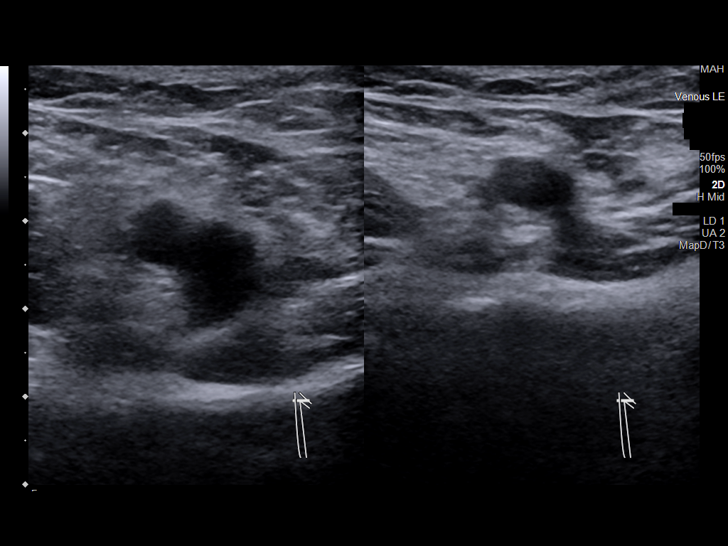
[im 4/45]
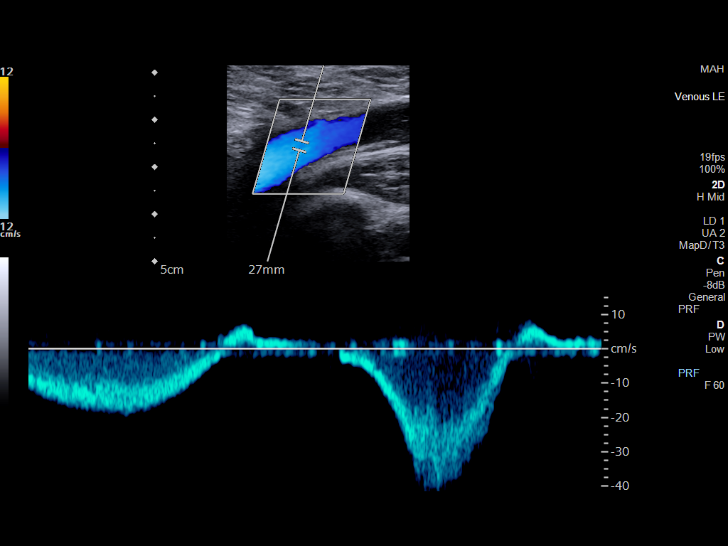
[im 8/45]
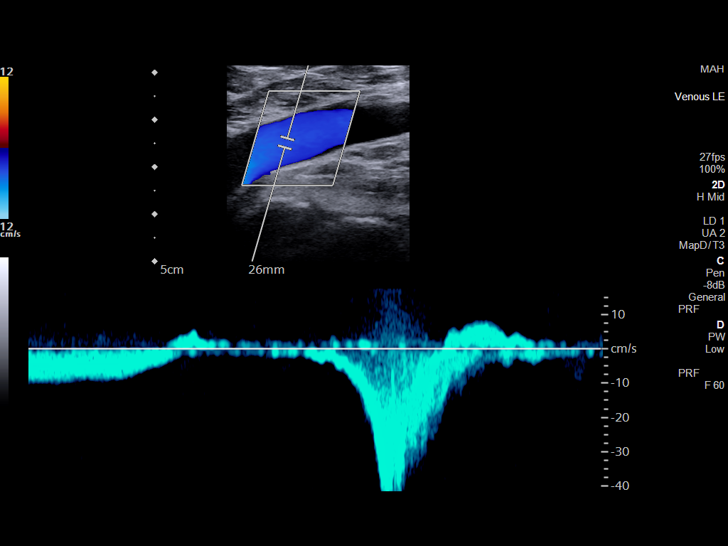
[im 12/45]
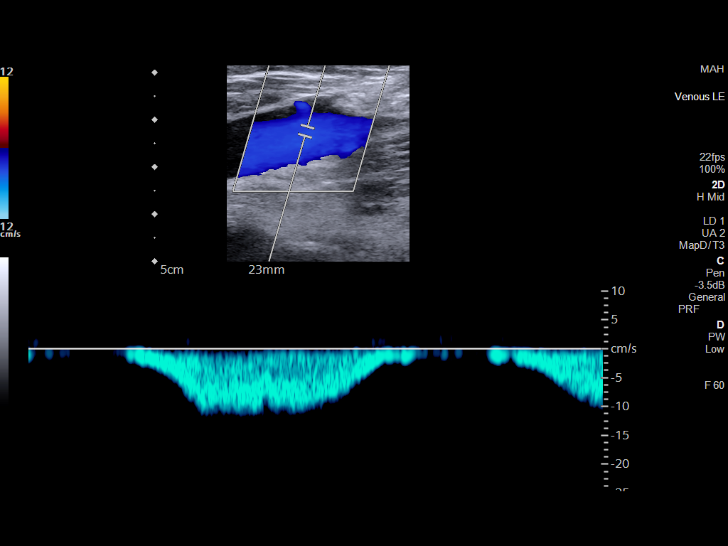
[im 16/45]
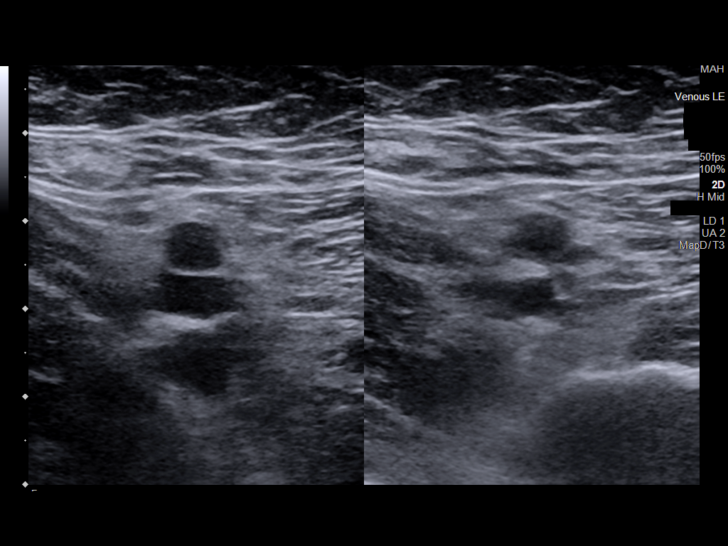
[im 20/45]
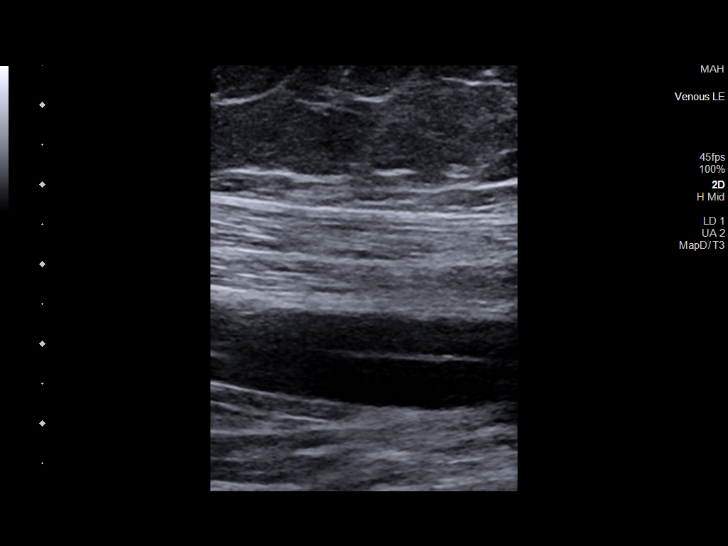
[im 23/45]
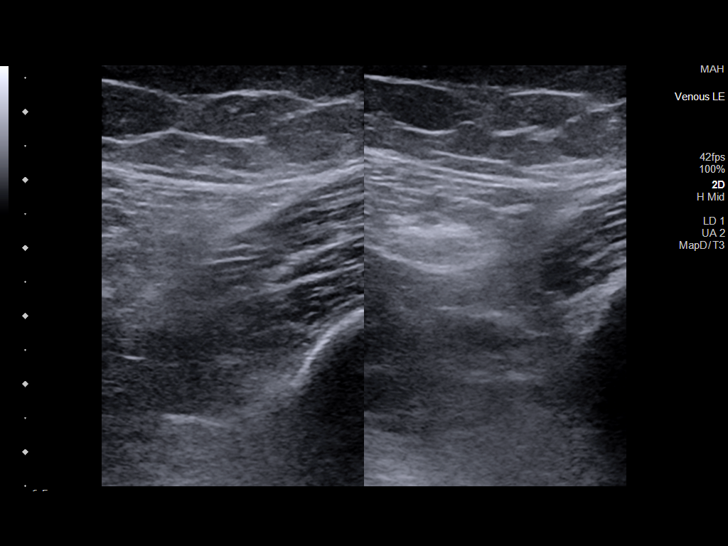
[im 25/45]
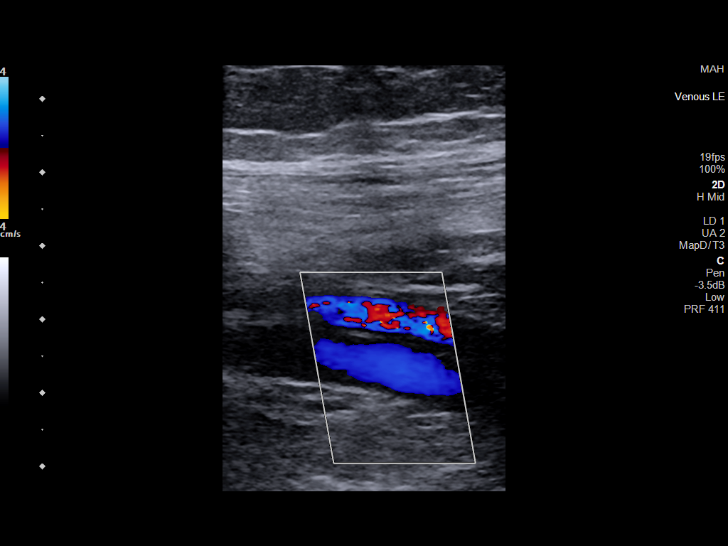
[im 29/45]
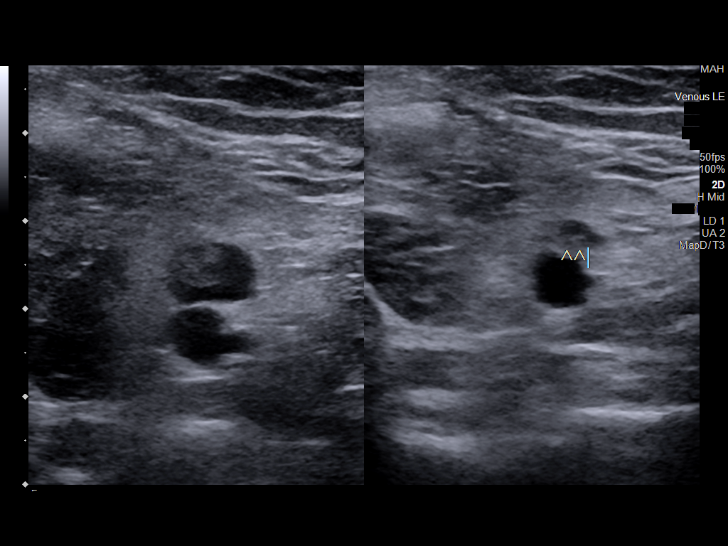
[im 33/45]
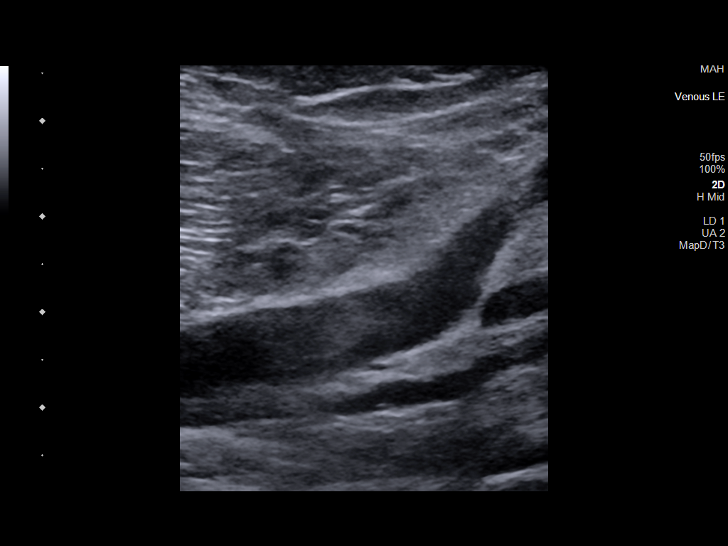
[im 37/45]
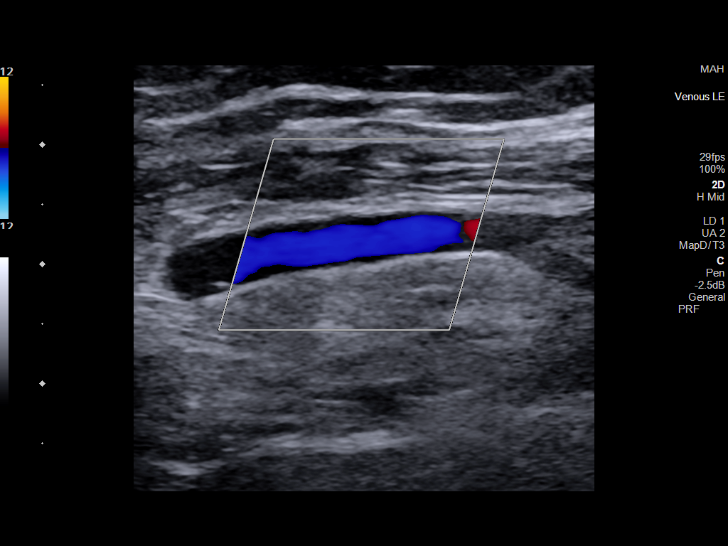
[im 41/45]
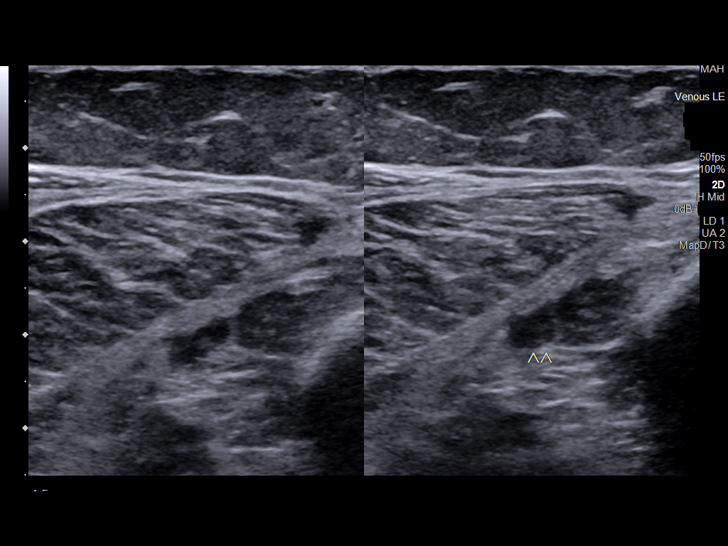
[im 45/45]
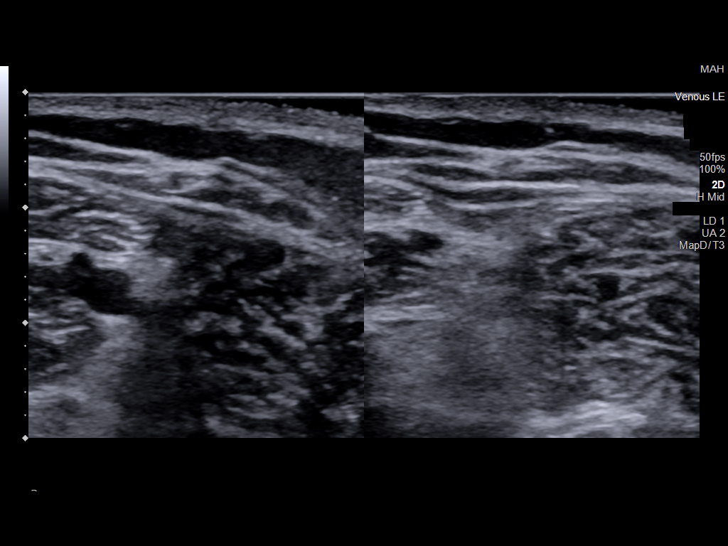

[13 of 24 positions shown; findings below may reference images not displayed]

FINDINGS: Contralateral Common Femoral Vein: Respiratory phasicity is normal
and symmetric with the symptomatic side. No evidence of thrombus.
Normal compressibility.

Common Femoral Vein: No evidence of thrombus. Normal
compressibility, respiratory phasicity and response to augmentation.

Saphenofemoral Junction: No evidence of thrombus. Normal
compressibility and flow on color Doppler imaging.

Profunda Femoral Vein: No evidence of thrombus. Normal
compressibility and flow on color Doppler imaging.

Femoral Vein: No evidence of thrombus. Normal compressibility,
respiratory phasicity and response to augmentation.

Popliteal Vein: Thrombosed.  Noncompressible.

Calf Veins: Posterior tibial and peroneal veins are thrombosed.

Superficial Great Saphenous Vein: No evidence of thrombus. Normal
compressibility.

Other Findings:  None.
IMPRESSION: Acute thrombosis of the popliteal, posterior tibial, and peroneal
veins.

These results will be called to the ordering clinician or
representative by the Radiologist Assistant, and communication
documented in the PACS or [REDACTED].

## 2021-03-15 MED ORDER — RIVAROXABAN (XARELTO) VTE STARTER PACK (15 & 20 MG)
ORAL_TABLET | ORAL | 0 refills | Status: AC
Start: 2021-03-15 — End: ?

## 2021-03-15 NOTE — ED Provider Notes (Signed)
[facility]    CSN: [id number] Arrival date & time: 03/15/21  0827      History   Chief Complaint Chief Complaint  Patient presents with   Leg Pain    HPI Regina Mason is a 40 y.o. female.   Patient presents today with a weeklong history of worsening left calf pain.  She denies any known injury or increase in activity prior to symptom onset and reports that symptoms began as a mild pain when she was standing in the shower and have gradually worsened particularly over the last 48 hours.  Currently pain is rated 7/8 on a 0-10 pain scale, localized to posterior left calf, described as throbbing with shooting pains during attempted ambulation, worse with palpation or ambulation, no leaving factors identified.  She has tried Tylenol and ibuprofen without improvement of symptoms.  She denies history of VTE event.  She is currently taking estrogen-containing birth control reports getting this medication approximately 3 weeks ago.  She denies any recent hospitalization, immobilization, recent travel, recent surgery.  She has not had COVID that she is aware of.  Denies any chest pain, shortness of breath, lightheadedness, palpitations.   Past Medical History:  Diagnosis Date   Complication of anesthesia    No pertinent past medical history    PONV (postoperative nausea and vomiting)    Postpartum care following vaginal delivery 04/05/2011    Patient Active Problem List   Diagnosis Date Noted   Well woman exam with routine gynecological exam 09/05/2017    Past Surgical History:  Procedure Laterality Date   CESAREAN SECTION      OB History     Gravida  2   Para  2   Term  2   Preterm      AB      Living  2      SAB      IAB      Ectopic      Multiple      Live Births  2            Home Medications    Prior to Admission medications   Medication Sig Start Date End Date Taking? Authorizing Provider  acetaminophen (TYLENOL) 500 MG tablet  Take 500 mg by mouth every 6 (six) hours as needed.    [provider]  acetaZOLAMIDE ER (DIAMOX) 500 MG capsule Take by mouth. 01/25/21   [provider]  RIVAROXABAN Carlena Hurl) VTE STARTER PACK (15 & 20 MG) Follow package directions: Take one  tablet by mouth twice a day. On day 22, switch to one  tablet once a day. Take with food. 03/15/21  Yes Ithiel Liebler, Noberto Retort, PA-C    Family History Family History  Problem Relation Age of Onset   Thyroid disease Mother    Hypertension Father    Breast cancer Maternal Aunt    Diabetes Maternal Grandmother    Heart disease Paternal Grandmother    Thyroid disease Brother    Sickle cell anemia Brother    Sleep apnea Brother    Anesthesia problems Neg Hx    Hypotension Neg Hx    Malignant hyperthermia Neg Hx    Pseudochol deficiency Neg Hx     Social History Social History   Tobacco Use   Smoking status: Never   Smokeless tobacco: Never  Vaping Use   Vaping Use: Never used  Substance Use Topics   Alcohol use: No   Drug use: No  Allergies   Patient has no known allergies.   Review of Systems Review of Systems  Constitutional:  Positive for activity change. Negative for appetite change, fatigue and fever.  Respiratory:  Negative for cough and shortness of breath.   Cardiovascular:  Positive for leg swelling. Negative for chest pain and palpitations.  Gastrointestinal:  Negative for abdominal pain, diarrhea, nausea and vomiting.  Musculoskeletal:  Positive for gait problem and myalgias. Negative for arthralgias.  Neurological:  Negative for dizziness, weakness, light-headedness, numbness and headaches.    Physical Exam Triage Vital Signs ED Triage Vitals  Enc Vitals Group     BP 03/15/21 0858 116/78     Pulse Rate 03/15/21 0858 92     Resp 03/15/21 0858 18     Temp 03/15/21 0858 98.4 F (36.9 C)     Temp Source 03/15/21 0858 Oral     SpO2 03/15/21 0858 100 %     Weight 03/15/21 0900 185 lb (83.9 kg)      Height --      Head Circumference --      Peak Flow --      Pain Score 03/15/21 0859 7     Pain Loc --      Pain Edu? --      Excl. in GC? --    No data found.  Updated Vital Signs BP 116/78 (BP Location: Right Arm)    Pulse 92    Temp 98.4 F (36.9 C) (Oral)    Resp 18    Wt 185 lb (83.9 kg)    LMP 02/22/2021 (Approximate)    SpO2 100%    BMI 33.84 kg/m   Visual Acuity Right Eye Distance:   Left Eye Distance:   Bilateral Distance:    Right Eye Near:   Left Eye Near:    Bilateral Near:     Physical Exam Vitals reviewed.  Constitutional:      General: She is awake. She is not in acute distress.    Appearance: Normal appearance. She is well-developed. She is not ill-appearing.     Comments: Very pleasant female appears stated age in no acute distress sitting comfortably in exam room  HENT:     Head: Normocephalic and atraumatic.     Mouth/Throat:     Pharynx: Uvula midline. No oropharyngeal exudate or posterior oropharyngeal erythema.  Cardiovascular:     Rate and Rhythm: Normal rate and regular rhythm.     Pulses:          Posterior tibial pulses are 2+ on the right side and 2+ on the left side.     Heart sounds: Normal heart sounds, S1 normal and S2 normal. No murmur heard.    Comments: Positive Homans' sign on left Pulmonary:     Effort: Pulmonary effort is normal.     Breath sounds: Normal breath sounds. No wheezing, rhonchi or rales.  Abdominal:     Palpations: Abdomen is soft.     Tenderness: There is no abdominal tenderness.  Musculoskeletal:     Right lower leg: No edema.     Left lower leg: Tenderness present. No deformity or bony tenderness. No edema.     Comments: Left leg: Significant tenderness to palpation over belly of gastrocnemius.  No palpable cord in popliteal fossa.  No deformity noted.  Psychiatric:        Behavior: Behavior is cooperative.     UC Treatments / Results  Labs (all labs ordered are listed, but only  abnormal results are  displayed) Labs Reviewed  CBC WITH DIFFERENTIAL/PLATELET  BASIC METABOLIC PANEL    EKG   Radiology US Venous Img Lower Unilateral Left  Result Date: 03/15/2021 CLINICAL DATA:  Left foot pain for 1 week but worsening in the past 2 days EXAM: LEFT LOWER EXTREMITY VENOUS DOPPLER ULTRASOUND TECHNIQUE: Gray-scale sonography with graded compression, as well as color Doppler and duplex ultrasound were performed to evaluate the lower extremity deep venous systems from the level of the common femoral vein and including the common femoral, femoral, profunda femoral, popliteal and calf veins including the posterior tibial, peroneal and gastrocnemius veins when visible. The superficial great saphenous vein was also interrogated. Spectral Doppler was utilized to evaluate flow at rest and with distal augmentation maneuvers in the common femoral, femoral and popliteal veins. COMPARISON:  None. FINDINGS: Contralateral Common Femoral Vein: Respiratory phasicity is normal and symmetric with the symptomatic side. No evidence of thrombus. Normal compressibility. Common Femoral Vein: No evidence of thrombus. Normal compressibility, respiratory phasicity and response to augmentation. Saphenofemoral Junction: No evidence of thrombus. Normal compressibility and flow on color Doppler imaging. Profunda Femoral Vein: No evidence of thrombus. Normal compressibility and flow on color Doppler imaging. Femoral Vein: No evidence of thrombus. Normal compressibility, respiratory phasicity and response to augmentation. Popliteal Vein: Thrombosed.  Noncompressible. Calf Veins: Posterior tibial and peroneal veins are thrombosed. Superficial Great Saphenous Vein: No evidence of thrombus. Normal compressibility. Other Findings:  None. IMPRESSION: Acute thrombosis of the popliteal, posterior tibial, and peroneal veins. These results will be called to the ordering clinician or representative by the Radiologist Assistant, and communication  documented in the PACS or [employer]. Electronically Signed   By: Acquanetta Belling M.D.   On: 03/15/2021 10:20    Procedures Procedures (including critical care time)  Medications Ordered in UC Medications - No data to display  Initial Impression / Assessment and Plan / UC Course  I have reviewed the triage vital signs and the nursing notes.  Pertinent labs & imaging results that were available during my care of the patient were reviewed by me and considered in my medical decision making (see chart for details).     Vital signs and physical exam are reassuring today; patient is well-appearing, nontachycardic, with oxygen saturation of 100%.  Ultrasound ordered given clinical presentation so to acute DVT in popliteal, posterior tibial, peroneal veins.  Patient was started on Xarelto with instruction to take 50 mg twice daily for 3 weeks then on day 22 switch to 20 mg daily.  She denies any history of chronic kidney disease and has no risk factors for this but we do not have a recent metabolic panel.  CBC and CMP were drawn today and we discussed that if her medication requires dose adjustment we will contact her and make this adjustment.  Recommended she use Tylenol for pain relief and was instructed to avoid NSAIDs with anticoagulation.  Encouraged her to use elevation and warm compresses for symptom relief.  Recommend she follow-up with her primary care provider soon as possible for recheck and to consider hematology referral.  Discussed at length alarm symptoms that warrant emergent evaluation including increased pain/leg swelling, chest pain, shortness of breath, heart racing.  Strict return precautions given to which she expressed understanding.  Final Clinical Impressions(s) / UC Diagnoses   Final diagnoses:  Left leg pain  Acute deep vein thrombosis (DVT) of distal vein of left lower extremity [facility])     Discharge Instructions  You have a blood clot in your left leg.  Please  start Xarelto as prescribed.  You should not take NSAIDs including aspirin, ibuprofen/Advil, naproxen/Aleve with this medication as it can cause stomach bleeding.  You can use Tylenol for pain.  Keep your leg elevated and avoid prolonged walking.  I recommend you follow-up with your primary care provider soon as possible for recheck and to consider hematology referral.  Stop your birth control as we discussed.  If you develop any chest pain, shortness of breath, heart racing, lightheadedness you need to go to the emergency room.     ED Prescriptions     Medication Sig Dispense Auth. Provider   RIVAROXABAN Carlena Hurl) VTE STARTER PACK (15 & 20 MG) Follow package directions: Take one 15mg  tablet by mouth twice a day. On day 22, switch to one 20mg  tablet once a day. Take with food. 51 each Izaah Westman K, PA-C      PDMP not reviewed this encounter.   , PA-C 03/15/21 1033

## 2021-03-15 NOTE — Discharge Instructions (Addendum)
You have a blood clot in your left leg.  Please start Xarelto as prescribed.  You should not take NSAIDs including aspirin, ibuprofen/Advil, naproxen/Aleve with this medication as it can cause stomach bleeding.  You can use Tylenol for pain.  Keep your leg elevated and avoid prolonged walking.  I recommend you follow-up with your primary care provider soon as possible for recheck and to consider hematology referral.  Stop your birth control as we discussed.  If you develop any chest pain, shortness of breath, heart racing, lightheadedness you need to go to the emergency room.

## 2021-03-15 NOTE — ED Triage Notes (Signed)
Pt states last Tuesday she suddenly developed left calf pain. States over the last 2 days pain is getting worse. Denies warmth and unsure about swelling. Pain is constant and worse with walking,

## 2021-03-19 ENCOUNTER — Ambulatory Visit (HOSPITAL_BASED_OUTPATIENT_CLINIC_OR_DEPARTMENT_OTHER): Payer: 59 | Admitting: Family Medicine

## 2021-04-19 ENCOUNTER — Ambulatory Visit: Payer: 59 | Admitting: Medical-Surgical

## 2021-05-02 ENCOUNTER — Other Ambulatory Visit (HOSPITAL_COMMUNITY)
Admission: RE | Admit: 2021-05-02 | Discharge: 2021-05-02 | Disposition: A | Discharge status: Home / Self Care | Payer: 59 | Source: Ambulatory Visit | Attending: Obstetrics & Gynecology | Admitting: Obstetrics & Gynecology

## 2021-05-02 ENCOUNTER — Encounter: Payer: Self-pay | Admitting: Obstetrics & Gynecology

## 2021-05-02 ENCOUNTER — Other Ambulatory Visit: Payer: Self-pay

## 2021-05-02 ENCOUNTER — Ambulatory Visit (INDEPENDENT_AMBULATORY_CARE_PROVIDER_SITE_OTHER): Payer: 59 | Admitting: Obstetrics & Gynecology

## 2021-05-02 VITALS — BP 111/73 | HR 87 | Wt 185.0 lb

## 2021-05-02 DIAGNOSIS — R87811 Vaginal high risk human papillomavirus (HPV) DNA test positive: Secondary | ICD-10-CM

## 2021-05-02 DIAGNOSIS — R87620 Atypical squamous cells of undetermined significance on cytologic smear of vagina (ASC-US): Secondary | ICD-10-CM | POA: Insufficient documentation

## 2021-05-02 NOTE — Progress Notes (Signed)
Patient given informed consent, signed copy in the chart, time out was performed.  Placed in lithotomy position. Cervix viewed with speculum and colposcope after application of acetic acid.  02/09/2021 Positive Abnormal     Adequacy Satisfactory for evaluation; transformation zone component PRESENT.   Diagnosis - Atypical squamous cells of undetermined significance (ASC-US) Abnormal    Comment Normal Reference Range HPV - Negative     Colposcopy adequate?  yes Acetowhite lesions?  yes Punctation?  no Mosaicism?   no Abnormal vasculature?  no Biopsies?  yes ECC?  yes  Patient was given post procedure instructions.  Will review resutls via MyChart. F/u in 1 year or sooner prn    Regina Mason, M.D., Regina Mason

## 2021-05-07 ENCOUNTER — Encounter: Payer: Self-pay | Admitting: General Practice

## 2021-05-07 LAB — SURGICAL PATHOLOGY

## 2021-06-20 ENCOUNTER — Telehealth: Payer: Self-pay

## 2021-06-20 ENCOUNTER — Encounter: Payer: Self-pay | Admitting: Obstetrics & Gynecology

## 2021-06-20 DIAGNOSIS — N871 Moderate cervical dysplasia: Secondary | ICD-10-CM | POA: Insufficient documentation

## 2021-06-20 NOTE — Telephone Encounter (Signed)
Pt called the office back requesting results. Pt made aware that her Colpo showed CIN2 and a LEEP is recommended. Pt is scheduled for 5/17 at 3:30 pm. Understanding was voiced. ?Oswin Griffith l Latoyna Hird, CMA  ?

## 2021-06-20 NOTE — Telephone Encounter (Signed)
error 

## 2021-06-20 NOTE — Telephone Encounter (Signed)
Left message for patient to return call to office for results. Wrenly Mariadelosang Wynns RN  

## 2021-06-20 NOTE — Telephone Encounter (Signed)
-----   Message from Ugonna A Anyanwu, MD sent at 06/20/2021  2:02 PM EDT ----- ?CIN 2 seen.  LEEP recommended. Patient can be seen by any provider to discuss management options or be scheduled for LEEP.  Please call to inform patient of results and recommendations. ? ?

## 2021-06-20 NOTE — Telephone Encounter (Signed)
-----   Message from Tereso Newcomer, MD sent at 06/20/2021  2:02 PM EDT ----- ?CIN 2 seen.  LEEP recommended. Patient can be seen by any provider to discuss management options or be scheduled for LEEP.  Please call to inform patient of results and recommendations. ? ?

## 2021-07-17 ENCOUNTER — Telehealth: Payer: Self-pay

## 2021-07-17 NOTE — Telephone Encounter (Signed)
Patient is scheduled for LEEP procedure on May 17th with Dr. Erin Fulling. ?Patient states she may be due for her period around then and patient is concerned about if Leep can be done in the office since she is on blood thinners.  ? ?Will route message to provider for review. Armandina Stammer RN  ?

## 2021-07-17 NOTE — Telephone Encounter (Signed)
Patient called and made aware that she can still come to her leep appointment per Dr. Erin Fulling. Armandina Stammer RN  ?

## 2021-07-23 ENCOUNTER — Ambulatory Visit
Admission: RE | Admit: 2021-07-23 | Discharge: 2021-07-23 | Disposition: A | Discharge status: Home / Self Care | Payer: 59 | Source: Ambulatory Visit | Attending: Family Medicine | Admitting: Family Medicine

## 2021-07-23 ENCOUNTER — Other Ambulatory Visit: Payer: Self-pay | Admitting: Family Medicine

## 2021-07-23 ENCOUNTER — Ambulatory Visit: Payer: 59

## 2021-07-23 DIAGNOSIS — R928 Other abnormal and inconclusive findings on diagnostic imaging of breast: Secondary | ICD-10-CM

## 2021-07-23 IMAGING — MG MM DIGITAL DIAGNOSTIC UNILAT*L* W/ TOMO W/ CAD
4 series · 4 of 12 positions shown · non-contrast
Comparison: Previous exam(s).

CLINICAL DATA: Follow-up for probably benign asymmetry within the
LEFT breast. This probably benign asymmetry was initially identified
on baseline screening mammogram dated [DATE].

EXAM:
DIGITAL DIAGNOSTIC UNILATERAL LEFT MAMMOGRAM WITH TOMOSYNTHESIS AND
CAD
TECHNIQUE: Left digital diagnostic mammography and breast tomosynthesis was
performed. The images were evaluated with computer-aided detection.

[L CC synth-2D]
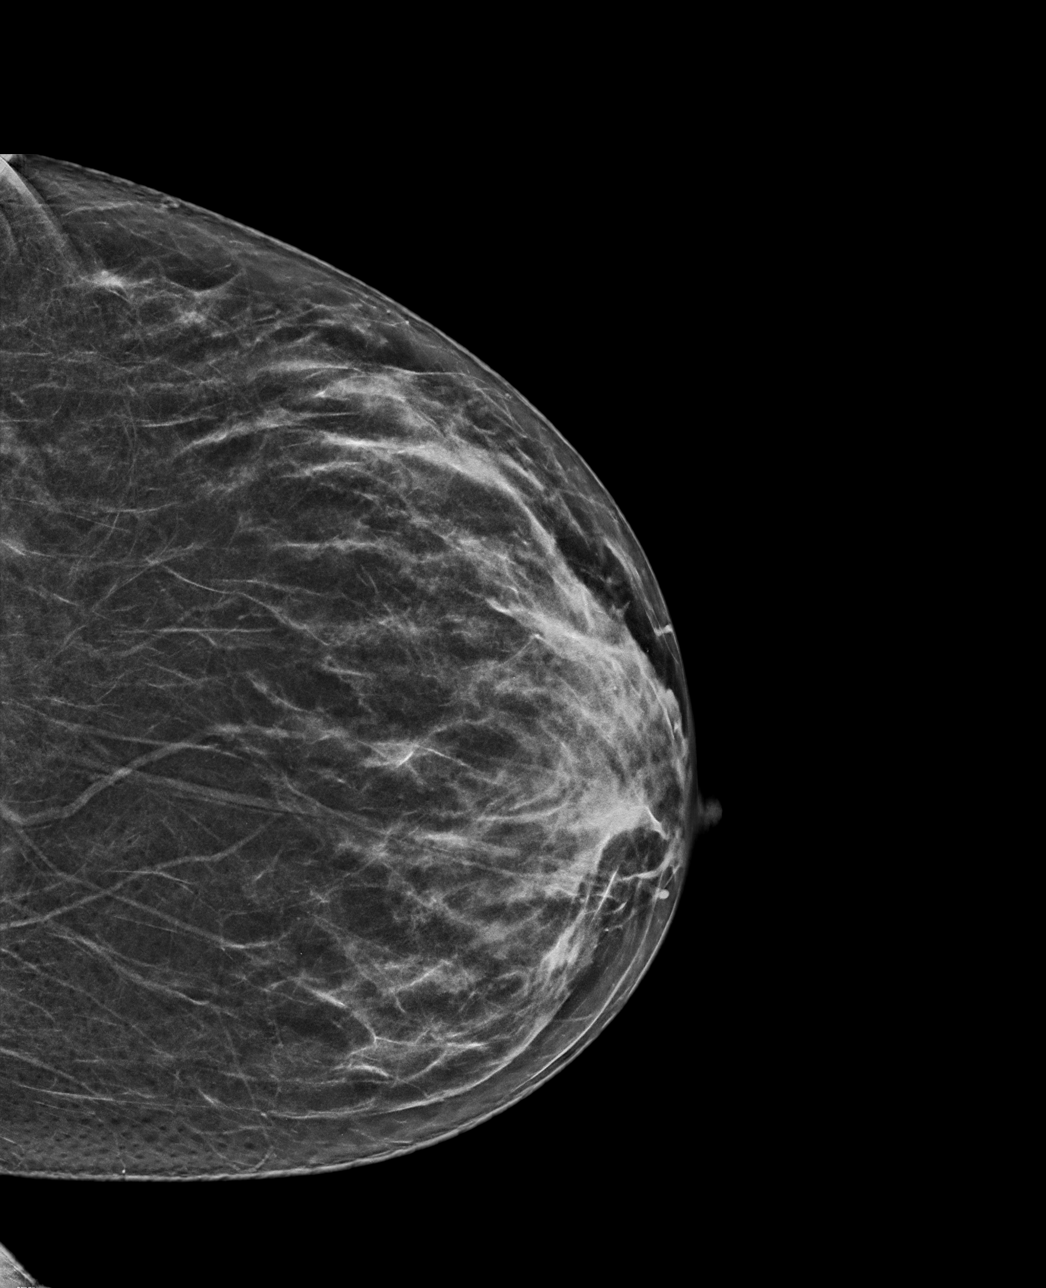

[L MLO synth-2D]
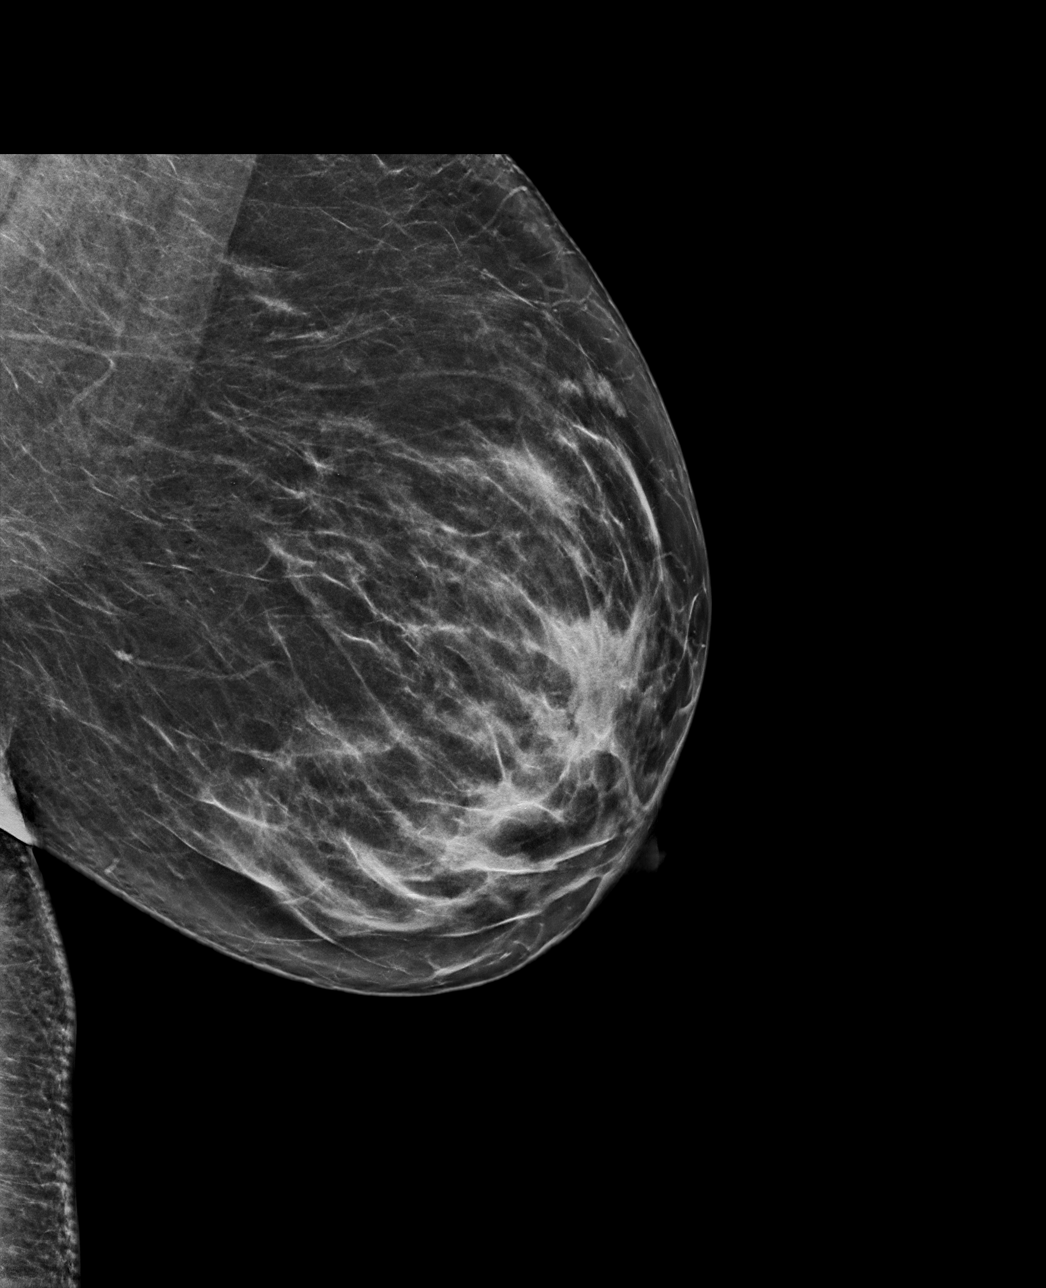

[L CC tomo · tomo slice 33/65.0]
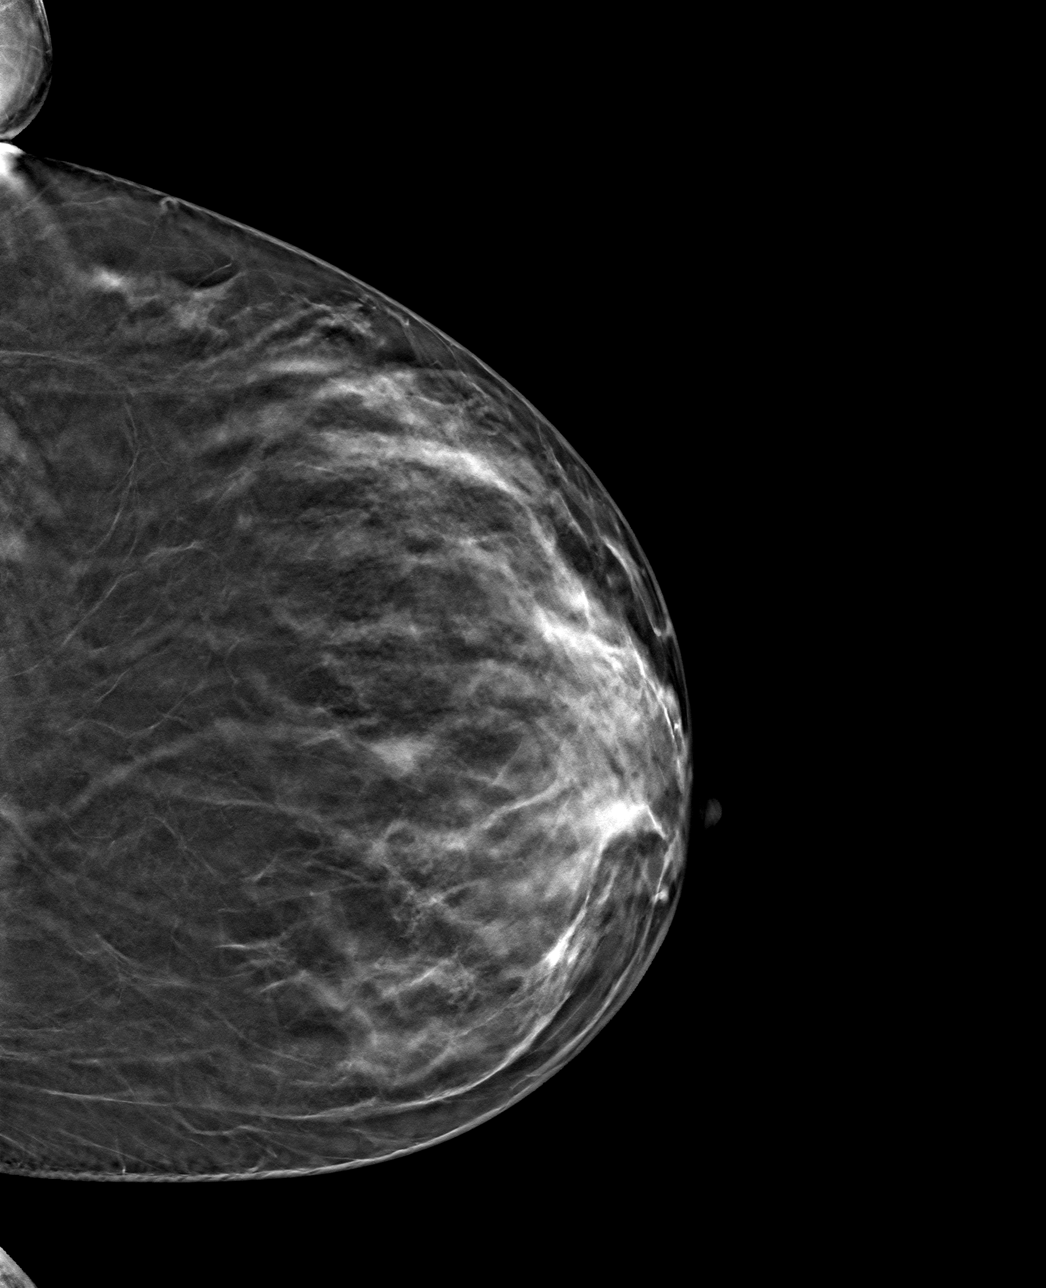

[L MLO tomo · tomo slice 39/76.0]
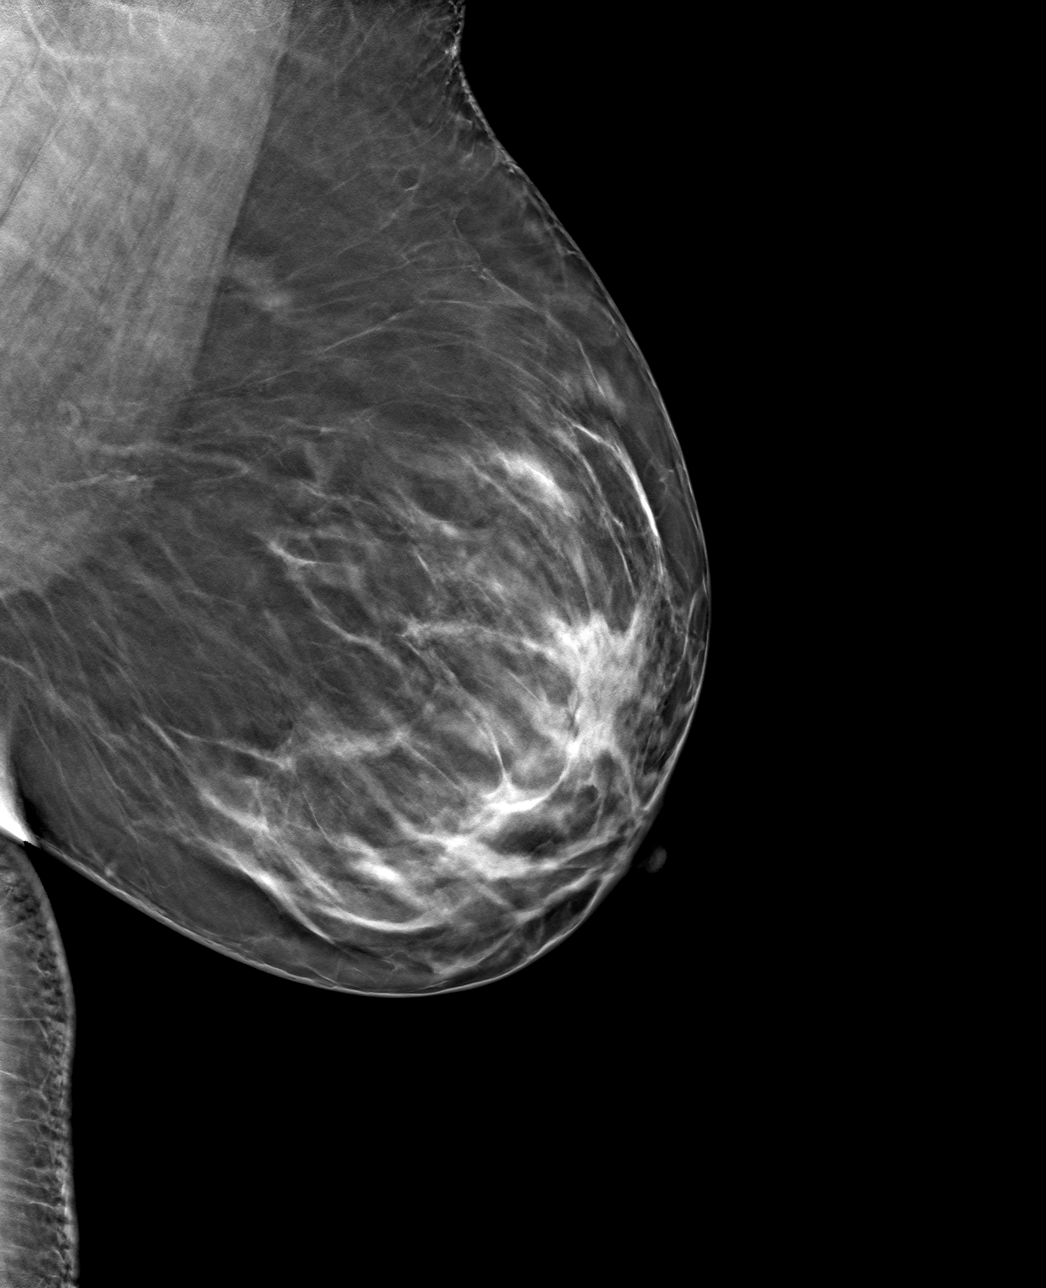

[4 of 12 positions shown; findings below may reference images not displayed]

ACR Breast Density Category c: The breast tissue is heterogeneously
dense, which may obscure small masses.
FINDINGS: The previously questioned asymmetry within the upper-outer quadrant
of the LEFT breast is not significantly changed, again most
suggestive of an island of normal/accessory fibroglandular breast
tissue. There are no new dominant masses, suspicious calcifications
or secondary signs of malignancy elsewhere within the LEFT breast.
IMPRESSION: Probably benign island of normal/accessory fibroglandular breast
tissue within the upper-outer quadrant of the LEFT breast, not
significantly changed compared to the previous exam. Recommend
additional follow-up diagnostic mammogram in 6 months to ensure
continued stability.

RECOMMENDATION:
Bilateral diagnostic mammogram in 6 months.

I have discussed the findings and recommendations with the patient.
If applicable, a reminder letter will be sent to the patient
regarding the next appointment.

BI-RADS CATEGORY  3: Probably benign.

## 2021-08-01 ENCOUNTER — Encounter: Payer: Self-pay | Admitting: Obstetrics & Gynecology

## 2021-08-01 ENCOUNTER — Other Ambulatory Visit (HOSPITAL_COMMUNITY)
Admission: RE | Admit: 2021-08-01 | Discharge: 2021-08-01 | Disposition: A | Discharge status: Home / Self Care | Payer: 59 | Source: Ambulatory Visit | Attending: Obstetrics & Gynecology | Admitting: Obstetrics & Gynecology

## 2021-08-01 ENCOUNTER — Ambulatory Visit (INDEPENDENT_AMBULATORY_CARE_PROVIDER_SITE_OTHER): Payer: 59 | Admitting: Obstetrics & Gynecology

## 2021-08-01 VITALS — BP 130/75 | HR 78 | Wt 186.0 lb

## 2021-08-01 DIAGNOSIS — N871 Moderate cervical dysplasia: Secondary | ICD-10-CM

## 2021-08-01 NOTE — Patient Instructions (Signed)
LEEP POST-PROCEDURE INSTRUCTIONS  You may take Ibuprofen, Aleve or Tylenol for pain if needed.  Cramping is normal.  You will have black and/or bloody discharge at first.  This will lighten and then turn clear before completely resolving.  This will take 2 to 3 weeks.  Put nothing in your vagina until the bleeding or discharge stops (usually 2 or3 days).  You need to call if you have redness around the biopsy site, if there is any unusual draining, if the bleeding is heavy, or if you are concerned.  Shower or bathe as normal  We will call you within one week with results or we will discuss the results at your follow-up appointment if needed.  You will need to return for a follow-up Pap smear as directed by your physician.  Loop Electrosurgical Excision Procedure Loop electrosurgical excision procedure (LEEP) is the cutting and removal (excision) of tissue from the cervix. The cervix is the bottom part of the uterus that opens into the vagina. The tissue that is removed from the cervix is examined to see if there are cancer cells or cells that might turn into cancer (precancerous cells). LEEP may be done when: You have abnormal bleeding from your cervix. You have an abnormal Pap test result. Your health care provider finds abnormalities on your cervix during an exam. LEEP typically only takes a few minutes and is often done in the health care provider's office. The procedure is safe for women who are trying to get pregnant. The procedure is usually not done during a menstrual period or during pregnancy. Tell a health care provider about: Any allergies you have. All medicines you are taking, including vitamins, herbs, eye drops, creams, and over-the-counter medicines. Any problems you or family members have had with anesthetic medicines. Any bleeding problems you have. Any medical conditions you have or have had. This includes current or past vaginal infections, such as herpes or STIs  (sexually transmitted infections). Whether you are pregnant or may be pregnant. If you are having vaginal bleeding on the day of the procedure. What are the risks? Generally, this is a safe procedure. However, problems may occur, including: Infection. Bleeding. Allergic reactions to medicines. Changes or scarring in the cervix. Damage to nearby structures or organs. Increased risk of early (preterm) labor in future pregnancies. What happens before the procedure? Ask your health care provider about: Changing or stopping your regular medicines. This is especially important if you are taking diabetes medicines or blood thinners. Taking medicines such as aspirin and ibuprofen. These medicines can thin your blood. Do not take these medicines unless your health care provider tells you to take them. Taking over-the-counter medicines, vitamins, herbs, and supplements. Your health care provider may recommend that you take pain medicine before the procedure. Ask your health care provider if you should plan to have a responsible adult take you home after the procedure. What happens during the procedure?  An instrument called a speculum will be placed in your vagina. This will allow your health care provider to see your cervix. You will be given a medicine to numb the area (local anesthetic). The medicine will be injected into your cervix and the surrounding area. A solution will be applied to your cervix. This solution will help the health care provider find the abnormal cells that need to be removed. A thin wire loop will be passed through your vagina to your cervix. The wire will remove layers of abnormal cervical cells. The wire will burn (cauterize)   the cervical tissue with an electrical current during cell removal. Open blood vessels will be cauterized to prevent bleeding. You might feel some pressure, aching, and cramping. If you feel like you will faint during the procedure, tell your health  care provider right away. A paste may be applied to the cauterized area of your cervix to help control bleeding. The sample of cervical tissue will be sent to a lab and looked at under a microscope. The procedure may vary among health care providers and hospitals. What can I expect after the procedure? After the procedure, it is common to have: Mild abdominal cramps that may last for up to 1 week. A small amount of pink-tinged or bloody vaginal discharge, including light to moderate bleeding, for 1-2 weeks. A brown- or black-colored discharge coming from your vagina, if a paste was used on the cervix to control bleeding. It is up to you to get the results of your procedure. Ask your health care provider, or the department that is doing the procedure, when your results will be ready. Follow these instructions at home: Take over-the-counter and prescription medicines only as told by your health care provider. Return to your normal activities as told by your health care provider. Ask your health care provider what activities are safe for you. Do not put anything in your vagina for 2 weeks after the procedure or until your health care provider says that it is okay. This includes tampons, creams, and douches. Do not have sex until your health care provider approves. Keep all follow-up visits. This is important. Contact a health care provider if: You have a fever or chills. You feel very weak. You have blood clots or bleeding that is heavier than a normal menstrual period. Bleeding that soaks a pad in less than 1 hour is considered heavy bleeding. You develop a bad-smelling discharge from your vagina. You have severe abdominal pain or cramping. Summary Loop electrosurgical excision procedure (LEEP) is the removal of tissue from the cervix. The removed tissue will be checked for precancerous cells or cancer cells. LEEP typically only takes a few minutes and is often done in your health care  provider's office. Do not put anything in your vagina for 2 weeks after the procedure or until your health care provider says that it is okay. This includes tampons, creams, and douches. Ask your health care provider, or the department that is doing the procedure, when your results will be ready. This information is not intended to replace advice given to you by your health care provider. Make sure you discuss any questions you have with your health care provider. Document Revised: 08/09/2020 Document Reviewed: 08/09/2020 Else[employer] Patient Education  2023 [employer]  

## 2021-08-01 NOTE — Progress Notes (Signed)
Pap smear and colposcopy reviewed.   Pap 01/24/2022 Positive Abnormal     Adequacy Satisfactory for evaluation; transformation zone component PRESENT.   Diagnosis - Atypical squamous cells of undetermined significance (ASC-US) Abnormal    Comment Normal Reference Range HPV - Negative    Colpo Biopsy 05/02/2021 FINAL MICROSCOPIC DIAGNOSIS:   A. ENDOCERVICAL, CURETTAGE:      - Moderate dysplasia, cervical intraepithelial neoplasia 2 (CIN 2).      -See comment.   B. CERVIX, 4 AND 6 O'CLOCK, BIOPSY:      - Mild dysplasia, cervical intraepithelial neoplasia 1 (CIN-1).      -See comment.   Risks, benefits, alternatives, and limitations of procedure explained to patient, including pain, bleeding, infection, failure to remove abnormal tissue and failure to cure dysplasia, need for repeat procedures, damage to pelvic organs, cervical incompetence.  Role of HPV,cervical dysplasia and need for close followup was empasized. Informed written consent was obtained. All questions were answered. Time out performed.  ??Procedure: The patient was placed in lithotomy position and the bivalved coated speculum was placed in the patient's vagina. A grounding pad placed on the patient. Local anesthesia was administered via an intracervical block using 10cc of 2% Lidocaine with epinephrine. The suction was turned on and the Medium 1X Fisher Cone Biopsy Excisor on 50 Watts of cutting current was used to excise the area of decreased uptake and excise the entire transformation zone. Excellent hemostasis was achieved using roller ball coagulation set at 60 Watts coagulation current. Monsel's solution was then applied and excellent hemostasis was noted.  The speculum was removed from the vagina. Specimens were sent to pathology. ?The patient tolerated the procedure well. Post-operative instructions given to patient, including instruction to seek medical attention for persistent bright red bleeding, fever, abdominal/pelvic pain,  dysuria, nausea or vomiting. She was also told about the possibility of having copious yellow to black tinged discharge. She was counseled to avoid anything in the vagina (sex/douching/tampons) for 4 weeks. She has a  2 week post-operative check to review results and assess wound healing. Follow up in 4 months for repeat pap or as needed.  Regina Mason, M.D., Evern Core

## 2021-08-03 LAB — SURGICAL PATHOLOGY

## 2021-08-14 ENCOUNTER — Telehealth: Payer: Self-pay | Admitting: General Practice

## 2021-08-14 NOTE — Telephone Encounter (Signed)
Transition Care Management Unsuccessful Follow-up Telephone Call  Date of discharge and from where:  08/11/21 from [facility]  Attempts:  1st Attempt  Reason for unsuccessful TCM follow-up call:  Left voice message

## 2021-08-15 ENCOUNTER — Telehealth: Payer: Self-pay | Admitting: Obstetrics & Gynecology

## 2021-08-15 NOTE — Telephone Encounter (Signed)
TC to pt. She has bleeding post LEEP 1 week post LEEP. Pt reports that now she is not having any sx. She bled a large amount and ended up with a blood transfusion.   All questions answered.   Pt will go to [facility] for follow up.   Kizzi Overbey L. Harraway-Smith, M.D., Cherlynn June

## 2021-08-15 NOTE — Telephone Encounter (Signed)
Transition Care Management Unsuccessful Follow-up Telephone Call  Date of discharge and from where:  08/11/21 from [facility]  Attempts:  2nd Attempt  Reason for unsuccessful TCM follow-up call:  Left voice message

## 2021-08-20 NOTE — Telephone Encounter (Signed)
Transition Care Management Unsuccessful Follow-up Telephone Call  Date of discharge and from where:  08/11/21 from [facility]  Attempts:  3rd Attempt  Reason for unsuccessful TCM follow-up call:  Left voice message

## 2021-08-29 ENCOUNTER — Ambulatory Visit: Payer: 59 | Admitting: Obstetrics & Gynecology

## 2021-11-28 ENCOUNTER — Telehealth: Payer: Self-pay | Admitting: General Practice

## 2021-11-28 NOTE — Telephone Encounter (Signed)
Called pt to schedule annual appt. Pt stated that she is seeing another doctor.

## 2022-01-25 ENCOUNTER — Ambulatory Visit
Admission: RE | Admit: 2022-01-25 | Discharge: 2022-01-25 | Disposition: A | Discharge status: Home / Self Care | Payer: BC Managed Care – PPO | Source: Ambulatory Visit | Attending: Family Medicine | Admitting: Family Medicine

## 2022-01-25 DIAGNOSIS — R928 Other abnormal and inconclusive findings on diagnostic imaging of breast: Secondary | ICD-10-CM

## 2022-05-21 ENCOUNTER — Emergency Department (HOSPITAL_BASED_OUTPATIENT_CLINIC_OR_DEPARTMENT_OTHER)
Admission: EM | Admit: 2022-05-21 | Discharge: 2022-05-21 | Disposition: A | Discharge status: Home / Self Care | Payer: BC Managed Care – PPO | Attending: Emergency Medicine | Admitting: Emergency Medicine

## 2022-05-21 ENCOUNTER — Ambulatory Visit
Admission: EM | Admit: 2022-05-21 | Discharge: 2022-05-21 | Disposition: A | Discharge status: Home / Self Care | Payer: BC Managed Care – PPO | Attending: Emergency Medicine | Admitting: Emergency Medicine

## 2022-05-21 ENCOUNTER — Other Ambulatory Visit: Payer: Self-pay

## 2022-05-21 DIAGNOSIS — L02211 Cutaneous abscess of abdominal wall: Secondary | ICD-10-CM | POA: Insufficient documentation

## 2022-05-21 DIAGNOSIS — T8029XA Infection following other infusion, transfusion and therapeutic injection, initial encounter: Secondary | ICD-10-CM

## 2022-05-21 MED ORDER — OXYCODONE HCL 5 MG PO TABS: 5.0000 mg | ORAL_TABLET | Freq: Four times a day (QID) | ORAL | 0 refills | Status: AC | PRN

## 2022-05-21 MED ORDER — IBUPROFEN 600 MG PO TABS: 600.0000 mg | ORAL_TABLET | Freq: Three times a day (TID) | ORAL | 0 refills | Status: AC | PRN

## 2022-05-21 MED ORDER — DOXYCYCLINE HYCLATE 100 MG PO CAPS: 100.0000 mg | ORAL_CAPSULE | Freq: Two times a day (BID) | ORAL | 0 refills | Status: AC

## 2022-05-21 MED ORDER — MORPHINE SULFATE (PF) 4 MG/ML IV SOLN
4.0000 mg | Freq: Once | INTRAVENOUS | Status: AC
  Administered 2022-05-21: 4 mg via SUBCUTANEOUS
  Filled 2022-05-21: qty 1

## 2022-05-21 MED ORDER — ONDANSETRON HCL 4 MG PO TABS: 4.0000 mg | ORAL_TABLET | Freq: Three times a day (TID) | ORAL | 0 refills | Status: AC | PRN

## 2022-05-21 MED ORDER — ONDANSETRON 4 MG PO TBDP
4.0000 mg | ORAL_TABLET | Freq: Once | ORAL | Status: AC
  Administered 2022-05-21: 4 mg via ORAL
  Filled 2022-05-21: qty 1

## 2022-05-21 NOTE — Discharge Instructions (Addendum)
Doxycycline twice daily for 5 days. Take with food to avoid upset stomach. If no improvement by 3 days of medicine, or if symptoms worsen, please be seen immediately.  Please do not inject your Lovenox into the area of infection. Rotate injection site frequently. Make sure to use a new needle each time  Apply warm compress to the area several times daily.continue tylenol.  Follow with your prescribing doctor.

## 2022-05-21 NOTE — ED Triage Notes (Signed)
Pt reports abscess to lower abdomen. Pt reports being seen at urgent care today and was given oral abx. Pt reports pain has gotten worse. Pt denies fever chills or body aches

## 2022-05-21 NOTE — ED Provider Notes (Signed)
[facility] Provider Note   CSN: [id number] Arrival date & time: 05/21/22  2100     History  Chief Complaint  Patient presents with   Abscess    Regina Mason is a 42 y.o. female post recent miscarriage approximately 4 weeks ago who presents to the ED complaining of painful abscess to the left lower abdomen.  Patient states that she just noticed this yesterday.  She has a history of DVT and thus has been completing Lovenox injections over the past 4 weeks for prophylaxis.  She has been taking Tylenol at home with no relief of her pain.  She was evaluated at urgent care today and prescribed doxycycline.  She has taken 1 dose of this medication.  She denies associated fever, nausea, vomiting, urinary symptoms, vaginal bleeding, vaginal discharge, pelvic pain, or abdominal pain apart from that localized to the area of the abscess.  Patient consulted with her OB/GYN due to current symptoms and was told to switch to thigh injections for the Lovenox instead of abdominal injections.      Home Medications Lovenox injections  Allergies    Estrogen [estrogenic substance]    Review of Systems   Review of Systems  All other systems reviewed and are negative.   Physical Exam Updated Vital Signs BP 120/70 (BP Location: Left Arm)   Pulse 81   Temp 97.7 F (36.5 C)   Resp 20   Ht '5\' 2"'$  (1.575 m)   Wt 90.7 kg   LMP 03/15/2022 (Approximate)   SpO2 98%   BMI 36.58 kg/m  Physical Exam Vitals and nursing note reviewed.  Constitutional:      General: She is not in acute distress.    Appearance: Normal appearance. She is not ill-appearing, toxic-appearing or diaphoretic.  HENT:     Head: Normocephalic and atraumatic.     Mouth/Throat:     Mouth: Mucous membranes are moist.  Eyes:     General: No scleral icterus.    Conjunctiva/sclera: Conjunctivae normal.  Cardiovascular:     Rate and Rhythm: Normal rate and regular rhythm.     Heart  sounds: No murmur heard. Pulmonary:     Effort: Pulmonary effort is normal.     Breath sounds: Normal breath sounds.  Abdominal:     General: Abdomen is flat. There is no distension.     Palpations: Abdomen is soft.     Tenderness: There is no guarding or rebound.     Comments: Scattered areas of ecchymosis in various stages of healing across the lower abdomen, 2 small circular areas of induration to left lower abdomen, one ~2cm in diameter, another ~3cm in diameter, minimal surrounding erythema, no fluctuance, no drainage, no increased warmth, these areas are tender to light palpation, remainder of abdomen non-tender with light and deep palpation  Musculoskeletal:        General: Normal range of motion.     Cervical back: Neck supple.     Right lower leg: No edema.     Left lower leg: No edema.  Skin:    General: Skin is warm and dry.     Capillary Refill: Capillary refill takes less than 2 seconds.  Neurological:     Mental Status: She is alert. Mental status is at baseline.  Psychiatric:        Behavior: Behavior normal.     ED Results / Procedures / Treatments   Labs (all labs ordered are listed, but only abnormal  results are displayed) Labs Reviewed - No data to display  EKG None  Radiology No results found.  Procedures Procedures    Medications Ordered in ED Medications  morphine (PF) 4 MG/ML injection 4 mg (4 mg Subcutaneous Given 05/21/22 2306)  ondansetron (ZOFRAN-ODT) disintegrating tablet 4 mg (4 mg Oral Given 05/21/22 2308)    ED Course/ Medical Decision Making/ A&P                             Medical Decision Making Risk Prescription drug management.   Medical Decision Making:   [name] is a 42 y.o. female who presented to the ED today with abscess to lower abdominal wall detailed above.    Patient's presentation is complicated by their history of recent miscarriage, current Lovenox injections.  Complete initial physical exam performed,  notably the patient was with stable vital signs, she had scattered areas of ecchymosis to the lower abdomen secondary to recent Lovenox injections, she had 2 small areas of induration to the left lower abdomen none upon palpation feel very superficial as above with localized tenderness and minimal erythema but no areas of fluctuance, no increased warmth, no streaking erythema, and apart from these 2 areas the remainder of the abdomen was soft and nontender without rebound or guarding. Reviewed and confirmed nursing documentation for past medical history, family history, social history.    Initial Assessment:   With the patient's presentation of abscess, most likely diagnosis is abscess. Differential diagnosis includes but is not limited to cellulitis, systemic infection, intra-abdominal infection.  This is most consistent with an acute complicated illness  Initial Plan:  Pain management and antiemetics to control nausea side effects Objective evaluation as reviewed   Final Assessment and Plan:   This is a 42 year old female presenting to the ED for evaluation of abscess to the left lower abdomen.  Patient states that she noticed pain and tenderness at the site yesterday and presented to outside urgent care for evaluation today where she was prescribed doxycycline.  Incision and drainage was not indicated at that time as patient had induration as noted above to the lower abdomen but no areas of fluctuance.  She has taken 1 dose of doxycycline has been taking Tylenol at home for pain without relief.  She presents to the ED today for uncontrolled pain.  Patient reports she has been giving herself Lovenox injections into the abdomen post miscarriage due to a past medical history of DVT.  She has been giving these injections into the abdomen.  Her doctor informed her today that she should change sites to give injections into the thigh instead.  On exam, patient has 2 small areas of induration to the left  lower abdomen as above but no signs of fluctuance, no increased warmth, no drainage, no streaking erythema.  She has no associated fever, chills, nausea, vomiting, or other signs of systemic infection or illness and vital signs are stable with a normal blood pressure, no tachycardia, and patient is afebrile.  Patient denies any deeper abdominal pain or pelvic pain pain seems to all be localized to the areas of induration which on palpation appear very superficial.  Unfortunately, I do not think there is anything to drain at this time.  I believe that patient would benefit from better pain control at home.  Discussed this with patient who is agreeable with plan.  Will send a small amount of pain medication for patient to take  at home as needed on top of Tylenol and ibuprofen.  Patient instructed to follow-up closely with OB/GYN as scheduled.  Patient given very strict ED return precautions, all questions answered, and stable for discharge.   Clinical Impression:  1. Injection site abscess, initial encounter      Discharge           Final Clinical Impression(s) / ED Diagnoses Final diagnoses:  Injection site abscess, initial encounter    Rx / DC Orders ED Discharge Orders          Ordered    oxyCODONE (ROXICODONE) 5 MG immediate release tablet  Every 6 hours PRN        05/21/22 2308    ibuprofen (ADVIL) 600 MG tablet  Every 8 hours PRN        05/21/22 2308    ondansetron (ZOFRAN) 4 MG tablet  Every 8 hours PRN        05/21/22 2308              Suzzette Righter, PA-C 05/21/22 2313    Fredia Sorrow, MD 05/23/22 2200

## 2022-05-21 NOTE — Discharge Instructions (Addendum)
Thank you for letting us take care of you today.  The abscesses to your abdomen do not have any areas that can be drained today. Your vital signs and the remainder of your physical exam were reassuring. Please continue to take the doxycycline as prescribed by the urgent care provider.  It is also very important to continue using warm compresses in order to help the abscess drain.  As preciously recommended, you should switch your Lovenox injections to the thighs instead of the abdomen.   I am prescribing you some pain medication to use over the next few days to help with your symptoms.  Please use the oxycodone only as needed for severe pain.  I also prescribed some nausea medication for you to take with this to help with any potential side effects.  I recommend that you take ibuprofen as prescribed consistently over the next few days to help get better control of your pain.  You may take Tylenol on top of this.  You may take up to 1000 mg of Tylenol at 1 time every 6 hours.  Do not take more than 4000 mg of Tylenol in 1 day.  Please follow-up with your primary care doctor for reevaluation of your abscess in the next 1 to 2 days.  If you develop fever, vomiting, streaking redness from the area of abscess, significant worsening pain to the abscess or abdominal pain, or other new, concerning symptoms, please return to the nearest emergency department, urgent care, or your primary care provider for reevaluation sooner.

## 2022-05-21 NOTE — ED Triage Notes (Signed)
Patient reports developing a painful hardened area on her lower abdomen when she has been giving her self Lovenox injections. This area began to be painful yesterday. She has been taking lovenox since January.

## 2022-05-21 NOTE — ED Provider Notes (Signed)
[facility]    CSN: [id number] Arrival date & time: 05/21/22  1430      History   Chief Complaint Chief Complaint  Patient presents with   Skin Problem    Lower abdomen    HPI Regina Mason is a 42 y.o. female.  Here with painful lump on abdomen She started Lovenox injections 4 week ago after positive preg test and hx of DVT/PE. Had a miscarriage. Supposed to finish 6 weeks of injections. Reports has been alternating the injection site. Yesterday the hard area became painful Tried tylenol without relief  Past Medical History:  Diagnosis Date   Complication of anesthesia    No pertinent past medical history    PONV (postoperative nausea and vomiting)    Postpartum care following vaginal delivery 04/05/2011    Patient Active Problem List   Diagnosis Date Noted   Dysplasia of cervix, high grade CIN 2 06/20/2021    Past Surgical History:  Procedure Laterality Date   CESAREAN SECTION      OB History     Gravida  2   Para  2   Term  2   Preterm      AB      Living  2      SAB      IAB      Ectopic      Multiple      Live Births  2            Home Medications    Prior to Admission medications   Medication Sig Start Date End Date Taking? Authorizing Provider  doxycycline (VIBRAMYCIN) 100 MG capsule Take 1 capsule (100 mg total) by mouth 2 (two) times daily for 5 days. 05/21/22 05/26/22 Yes Maleeha Halls, Wells Guiles, PA-C  acetaminophen (TYLENOL) 500 MG tablet Take 500 mg by mouth every 6 (six) hours as needed.    [provider]  acetaZOLAMIDE ER (DIAMOX) 500 MG capsule Take by mouth. 01/25/21   [provider]  RIVAROXABAN Alveda Reasons) VTE STARTER PACK (15 & 20 MG) Follow package directions: Take one '15mg'$  tablet by mouth twice a day. On day 22, switch to one '20mg'$  tablet once a day. Take with food. 03/15/21   Raspet, Derry Skill, PA-C    Family History Family History  Problem Relation Age of Onset   Thyroid disease Mother     Hypertension Father    Breast cancer Maternal Aunt    Diabetes Maternal Grandmother    Heart disease Paternal Grandmother    Breast cancer Cousin 79       maternal 1st cousin   Thyroid disease Brother    Sickle cell anemia Brother    Sleep apnea Brother    Anesthesia problems Neg Hx    Hypotension Neg Hx    Malignant hyperthermia Neg Hx    Pseudochol deficiency Neg Hx     Social History Social History   Tobacco Use   Smoking status: Never   Smokeless tobacco: Never  Vaping Use   Vaping Use: Never used  Substance Use Topics   Alcohol use: No   Drug use: No     Allergies   Estrogen [estrogenic substance]   Review of Systems Review of Systems As per HPI  Physical Exam Triage Vital Signs ED Triage Vitals  Enc Vitals Group     BP 05/21/22 1444 117/74     Pulse Rate 05/21/22 1444 80     Resp 05/21/22 1444 16  Temp 05/21/22 1444 98.4 F (36.9 C)     Temp Source 05/21/22 1444 Oral     SpO2 05/21/22 1444 99 %     Weight --      Height --      Head Circumference --      Peak Flow --      Pain Score 05/21/22 1443 6     Pain Loc --      Pain Edu? --      Excl. in [place]? --    No data found.  Updated Vital Signs BP 117/74 (BP Location: Left Arm)   Pulse 80   Temp 98.4 F (36.9 C) (Oral)   Resp 16   SpO2 99%   Physical Exam Vitals and nursing note reviewed.  Constitutional:      General: She is not in acute distress. HENT:     Mouth/Throat:     Pharynx: Oropharynx is clear.  Cardiovascular:     Rate and Rhythm: Normal rate and regular rhythm.     Heart sounds: Normal heart sounds.  Pulmonary:     Effort: Pulmonary effort is normal.     Breath sounds: Normal breath sounds.  Abdominal:     Palpations: There is mass.     Tenderness: There is abdominal tenderness in the left lower quadrant.       Comments: Indurated area about 4 inches long, 2 inches wide at the left lower abdomen. Tender to touch. Mild erythema. Skin is hot. There is no fluctuance  or superficial drainage. Scattered small bruises on abdomen.  Skin:    General: Skin is warm and dry.  Neurological:     Mental Status: She is alert and oriented to person, place, and time.      UC Treatments / Results  Labs (all labs ordered are listed, but only abnormal results are displayed) Labs Reviewed - No data to display  EKG  Radiology No results found.  Procedures Procedures (including critical care time)  Medications Ordered in UC Medications - No data to display  Initial Impression / Assessment and Plan / UC Course  I have reviewed the triage vital signs and the nursing notes.  Pertinent labs & imaging results that were available during my care of the patient were reviewed by me and considered in my medical decision making (see chart for details).  Concern for infection given induration, tenderness, warmth Cover for MRSA with doxy BID x 5 days. Tylenol, warm compress. Avoid any further injections in this area. Strict ED precautions.   Final Clinical Impressions(s) / UC Diagnoses   Final diagnoses:  Injection site abscess, initial encounter     Discharge Instructions      Doxycycline twice daily for 5 days. Take with food to avoid upset stomach. If no improvement by 3 days of medicine, or if symptoms worsen, please be seen immediately.  Please do not inject your Lovenox into the area of infection. Rotate injection site frequently. Make sure to use a new needle each time  Apply warm compress to the area several times daily.continue tylenol.  Follow with your prescribing doctor.    ED Prescriptions     Medication Sig Dispense Auth. Provider   doxycycline (VIBRAMYCIN) 100 MG capsule Take 1 capsule (100 mg total) by mouth 2 (two) times daily for 5 days. 10 capsule Sufian Ravi, Wells Guiles, PA-C      PDMP not reviewed this encounter.   Les Pou, [place] 05/21/22 [phone]

## 2022-05-22 ENCOUNTER — Telehealth: Payer: Self-pay | Admitting: General Practice

## 2022-05-22 NOTE — Transitions of Care (Post Inpatient/ED Visit) (Signed)
   05/22/2022  Name: Rondia Charley MRN: [id number] DOB: 1980/08/27  Today's TOC FU Call Status: Today's TOC FU Call Status:: Successful TOC FU Call Competed TOC FU Call Complete Date: 05/22/22  Transition Care Management Follow-up Telephone Call Date of Discharge: 05/21/22 Discharge Facility: [facility] Type of Discharge: Emergency Department Reason for ED Visit: Other: (abscess)  Patient stated that she has a different PCP and asked to have Dr. Zigmund Daniel removed as PCP.   SIGNATURE: Tinnie Gens, RN BSN

## 2022-12-25 ENCOUNTER — Other Ambulatory Visit: Payer: Self-pay | Admitting: Family Medicine

## 2022-12-25 DIAGNOSIS — R928 Other abnormal and inconclusive findings on diagnostic imaging of breast: Secondary | ICD-10-CM

## 2023-01-28 ENCOUNTER — Ambulatory Visit
Admission: RE | Admit: 2023-01-28 | Discharge: 2023-01-28 | Disposition: A | Discharge status: Home / Self Care | Payer: BC Managed Care – PPO | Source: Ambulatory Visit | Attending: Family Medicine | Admitting: Family Medicine

## 2023-01-28 DIAGNOSIS — R928 Other abnormal and inconclusive findings on diagnostic imaging of breast: Secondary | ICD-10-CM

## 2024-01-05 ENCOUNTER — Other Ambulatory Visit: Payer: Self-pay | Admitting: Family Medicine

## 2024-01-05 DIAGNOSIS — Z1231 Encounter for screening mammogram for malignant neoplasm of breast: Secondary | ICD-10-CM

## 2024-01-29 ENCOUNTER — Ambulatory Visit: Payer: Self-pay
